# Patient Record
Sex: Female | Born: 1990 | Hispanic: No | Marital: Single | Smoking: Former smoker
Health system: Southern US, Community
[De-identification: ages and names within clinical notes are randomized; demographics above are authoritative.]

## PROBLEM LIST (undated history)

## (undated) DIAGNOSIS — Z9889 Other specified postprocedural states: Secondary | ICD-10-CM

## (undated) DIAGNOSIS — Z8759 Personal history of other complications of pregnancy, childbirth and the puerperium: Secondary | ICD-10-CM

## (undated) DIAGNOSIS — F419 Anxiety disorder, unspecified: Secondary | ICD-10-CM

## (undated) DIAGNOSIS — Q784 Enchondromatosis: Secondary | ICD-10-CM

## (undated) DIAGNOSIS — N941 Unspecified dyspareunia: Secondary | ICD-10-CM

## (undated) DIAGNOSIS — N912 Amenorrhea, unspecified: Secondary | ICD-10-CM

## (undated) DIAGNOSIS — R102 Pelvic and perineal pain unspecified side: Secondary | ICD-10-CM

## (undated) DIAGNOSIS — R112 Nausea with vomiting, unspecified: Secondary | ICD-10-CM

## (undated) HISTORY — PX: OTHER SURGICAL HISTORY: SHX169

---
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
## 2010-10-29 ENCOUNTER — Emergency Department: Payer: Self-pay | Admitting: Emergency Medicine

## 2014-03-10 ENCOUNTER — Emergency Department: Payer: Self-pay | Admitting: Emergency Medicine

## 2014-03-10 LAB — COMPREHENSIVE METABOLIC PANEL
ALBUMIN: 4.1 g/dL (ref 3.4–5.0)
ALT: 29 U/L
Alkaline Phosphatase: 39 U/L — ABNORMAL LOW
Anion Gap: 7 (ref 7–16)
BUN: 5 mg/dL — ABNORMAL LOW (ref 7–18)
Bilirubin,Total: 0.5 mg/dL (ref 0.2–1.0)
CALCIUM: 9 mg/dL (ref 8.5–10.1)
CHLORIDE: 106 mmol/L (ref 98–107)
CO2: 26 mmol/L (ref 21–32)
CREATININE: 0.71 mg/dL (ref 0.60–1.30)
EGFR (African American): >60
EGFR (Non-African Amer.): >60
Glucose: 85 mg/dL (ref 65–99)
OSMOLALITY: 274 (ref 275–301)
Potassium: 3.4 mmol/L — ABNORMAL LOW (ref 3.5–5.1)
SGOT(AST): 30 U/L (ref 15–37)
SODIUM: 139 mmol/L (ref 136–145)
Total Protein: 7.9 g/dL (ref 6.4–8.2)

## 2014-03-10 LAB — URINALYSIS, COMPLETE
BILIRUBIN, UR: NEGATIVE
Blood: NEGATIVE
GLUCOSE, UR: NEGATIVE mg/dL (ref 0–75)
Ketone: NEGATIVE
LEUKOCYTE ESTERASE: NEGATIVE
Nitrite: NEGATIVE
Ph: 8 (ref 4.5–8.0)
Protein: NEGATIVE
RBC,UR: NONE SEEN /HPF (ref 0–5)
SPECIFIC GRAVITY: 1.015 (ref 1.003–1.030)

## 2014-03-10 LAB — DRUG SCREEN, URINE
Amphetamines, Ur Screen: NEGATIVE (ref ?–1000)
BARBITURATES, UR SCREEN: NEGATIVE (ref ?–200)
Benzodiazepine, Ur Scrn: NEGATIVE (ref ?–200)
Cannabinoid 50 Ng, Ur ~~LOC~~: POSITIVE (ref ?–50)
Cocaine Metabolite,Ur ~~LOC~~: NEGATIVE (ref ?–300)
MDMA (Ecstasy)Ur Screen: NEGATIVE (ref ?–500)
Methadone, Ur Screen: NEGATIVE (ref ?–300)
Opiate, Ur Screen: NEGATIVE (ref ?–300)
Phencyclidine (PCP) Ur S: NEGATIVE (ref ?–25)
TRICYCLIC, UR SCREEN: NEGATIVE (ref ?–1000)

## 2014-03-10 LAB — CBC WITH DIFFERENTIAL/PLATELET
BASOS PCT: 0.8 %
Basophil #: 0 10*3/uL (ref 0.0–0.1)
EOS PCT: 3.1 %
Eosinophil #: 0.2 10*3/uL (ref 0.0–0.7)
HCT: 44.8 % (ref 35.0–47.0)
HGB: 15.1 g/dL (ref 12.0–16.0)
LYMPHS PCT: 33.8 %
Lymphocyte #: 1.8 10*3/uL (ref 1.0–3.6)
MCH: 31.4 pg (ref 26.0–34.0)
MCHC: 33.7 g/dL (ref 32.0–36.0)
MCV: 93 fL (ref 80–100)
Monocyte #: 0.5 x10 3/mm (ref 0.2–0.9)
Monocyte %: 8.7 %
Neutrophil #: 2.9 10*3/uL (ref 1.4–6.5)
Neutrophil %: 53.6 %
Platelet: 212 10*3/uL (ref 150–440)
RBC: 4.81 10*6/uL (ref 3.80–5.20)
RDW: 13.3 % (ref 11.5–14.5)
WBC: 5.4 10*3/uL (ref 3.6–11.0)

## 2014-03-15 ENCOUNTER — Emergency Department: Payer: Self-pay | Admitting: Student

## 2014-08-16 ENCOUNTER — Observation Stay: Payer: Self-pay | Admitting: Obstetrics & Gynecology

## 2014-10-02 ENCOUNTER — Observation Stay: Payer: Self-pay

## 2014-10-22 ENCOUNTER — Observation Stay: Payer: Self-pay | Admitting: Obstetrics and Gynecology

## 2014-11-04 ENCOUNTER — Inpatient Hospital Stay
Admit: 2014-11-04 | Disposition: A | Discharge status: Home / Self Care | Payer: Self-pay | Attending: Advanced Practice Midwife | Admitting: Advanced Practice Midwife

## 2014-11-04 LAB — CBC WITH DIFFERENTIAL/PLATELET
BASOS ABS: 0 10*3/uL (ref 0.0–0.1)
Basophil %: 0.3 %
EOS ABS: 0.1 10*3/uL (ref 0.0–0.7)
Eosinophil %: 0.9 %
HCT: 35.8 % (ref 35.0–47.0)
HGB: 11.6 g/dL — ABNORMAL LOW (ref 12.0–16.0)
LYMPHS ABS: 1 10*3/uL (ref 1.0–3.6)
Lymphocyte %: 11.7 %
MCH: 26.7 pg (ref 26.0–34.0)
MCHC: 32.5 g/dL (ref 32.0–36.0)
MCV: 82 fL (ref 80–100)
Monocyte #: 0.7 x10 3/mm (ref 0.2–0.9)
Monocyte %: 7.7 %
Neutrophil #: 6.8 10*3/uL — ABNORMAL HIGH (ref 1.4–6.5)
Neutrophil %: 79.4 %
Platelet: 210 10*3/uL (ref 150–440)
RBC: 4.36 10*6/uL (ref 3.80–5.20)
RDW: 15.1 % — AB (ref 11.5–14.5)
WBC: 8.6 10*3/uL (ref 3.6–11.0)

## 2014-11-04 LAB — DRUG SCREEN, URINE
Amphetamines, Ur Screen: NEGATIVE
BARBITURATES, UR SCREEN: NEGATIVE
Benzodiazepine, Ur Scrn: NEGATIVE
Cannabinoid 50 Ng, Ur ~~LOC~~: POSITIVE
Cocaine Metabolite,Ur ~~LOC~~: NEGATIVE
MDMA (Ecstasy)Ur Screen: NEGATIVE
METHADONE, UR SCREEN: NEGATIVE
OPIATE, UR SCREEN: NEGATIVE
Phencyclidine (PCP) Ur S: NEGATIVE
TRICYCLIC, UR SCREEN: NEGATIVE

## 2014-11-04 LAB — GC/CHLAMYDIA PROBE AMP

## 2014-11-06 LAB — HEMATOCRIT: HCT: 30.6 % — ABNORMAL LOW (ref 35.0–47.0)

## 2014-12-17 NOTE — H&P (Signed)
L&D Evaluation:  History Expanded:  HPI 24 yo G1 at 6228 weeks w vag bleeding episode this am. Bleeding was brief and noted at work.   No significant pain.  Sex night before last.  No compl this preg.  [facility] Care at [facility].   Gravida 1   Term 0   Patient's Medical History No Chronic Illness   Patient's Surgical History none   Medications Pre Natal Vitamins   Allergies NKDA   Social History none   Family History Non-Contributory   ROS:  ROS All systems were reviewed.  HEENT, CNS, GI, GU, Respiratory, CV, Renal and Musculoskeletal systems were found to be normal.   Exam:  Vital Signs stable   General no apparent distress   Mental Status clear   Abdomen gravid, non-tender   Estimated Fetal Weight Average for gestational age   Edema no edema   Pelvic no external lesions, cervix closed and thick, (cervix is soft, no dilation effacement or descent)   Mebranes Intact   FHT normal rate with no decels   Ucx absent   Impression:  Impression Vaginal bleeding, post coital.  No signs or symptoms of Preterm Labor.   Plan:  Plan EFM/NST   Comments Pelvic rest for a few days tp monitor for bleeding. Keep follow-up appointment Tuesday.   Follow Up Appointment already scheduled   Electronic Signatures: Letitia LibraHarris, Semaj Kham Paul (MD)  (Signed 08-Jan-16 10:13)  Authored: L&D Evaluation   Last Updated: 08-Jan-16 10:13 by Letitia LibraHarris, Latria Mccarron Paul (MD)

## 2014-12-17 NOTE — H&P (Signed)
L&D Evaluation:  History:  HPI 24 yo G1 at 35.[redacted] weeks GA with an EDC of 11/04/14 presents to L&D after reportedly calling the office and being told to come to the hospital with reports of mucus with some blood present in it. Her prenatal course is significant for anxiety and depression. She also has been using MJ this pregnancy with the last +UDS in December. She was seen in the office yesterday with reports of abdominal pain but was not able to pinpoint where she was hurting other than "on my bone." She had her cervix checked and was 2/80/-2 at that time. She was instructed that she may have some bleeding at that point. She is AB+, VI, RI, GBS unknown   Presents with mucus discharge   Patient's Medical History No Chronic Illness   Patient's Surgical History none   Medications Pre Natal Vitamins   Allergies other, bee stings, cinnamon   Social History drugs  MJ use   Family History Non-Contributory   ROS:  ROS All systems were reviewed.  HEENT, CNS, GI, GU, Respiratory, CV, Renal and Musculoskeletal systems were found to be normal.   Exam:  Vital Signs stable   General no apparent distress   Mental Status clear   Chest clear   Heart normal sinus rhythm   Abdomen gravid, non-tender   Pelvic 1.5/70-80/-2 unchanged. no blood seen on glove   Mebranes Intact   FHT normal rate with no decels, 130s, moderate variability, +accels, no decels   Ucx other, occassional   Skin dry, no lesions, no rashes   Lymph no lymphadenopathy   Impression:  Impression reactive NST, IUP at 35.2, vaginal discharge   Plan:  Plan EFM/NST, monitor contractions and for cervical change, discharge home with PTL precautions if cervix does not change.   Follow Up Appointment already scheduled. in 1 week   Electronic Signatures: Jannet MantisSubudhi, Bentley Fissel (CNM)  (Signed 24-Feb-16 16:01)  Authored: L&D Evaluation   Last Updated: 24-Feb-16 16:01 by Jannet MantisSubudhi, Rito Lecomte (CNM)

## 2014-12-17 NOTE — H&P (Signed)
L&D Evaluation:  History:  HPI 24 yo G1 at 38 weeks 1 day gestation with an EDC of 11/04/14 presents after clinic BP of 140/100 no repeat.  +1 glucose on that particular visit as well as on one prior visit with +4 glucose but negative protein (1-hr was 60).  NO HA, VISION CHANGES, RUQ/EPIGASTRIC PAIN.  Weight stable from last week.  Her prenatal course is significant for anxiety and depression. She also has been using MJ this pregnancy with the last +UDS in December.  PNL AB+, VI, RI, GBS unknown   Presents with mucus discharge   Patient's Medical History No Chronic Illness   Patient's Surgical History none   Medications Pre Natal Vitamins   Allergies other, bee stings, cinnamon   Social History drugs  MJ use   Family History Non-Contributory   ROS:  ROS All systems were reviewed.  HEENT, CNS, GI, GU, Respiratory, CV, Renal and Musculoskeletal systems were found to be normal.   Exam:  Vital Signs stable  No elevated BP's noted through her stay in L&D   General no apparent distress   Mental Status clear   Chest clear   Heart normal sinus rhythm   Abdomen gravid, non-tender   Pelvic 2cm per nursing stafff   Mebranes Intact   FHT normal rate with no decels, 130.  moderate variability, +accels, no decels   Ucx other, occassional   Skin dry, no lesions, no rashes   Lymph no lymphadenopathy   Impression:  Impression reactive NST, IUP at38 week 1 day with negative preeclampsia work up and normotensive herre on L&D   Plan:  Plan monitor contractions and for cervical change   Comments - no follow up appointment made in clinic today will arrange follow up later this week.  No evidence of hypertensive disorder complicating pregnancy here on L&D ith normal labs and category I fetal tracing   Follow Up Appointment need to schedule. in 1 week   Electronic Signatures: Lorrene ReidStaebler, Terrika Zuver M (MD)  (Signed 15-Mar-16 16:16)  Authored: L&D Evaluation   Last Updated:  15-Mar-16 16:16 by Lorrene ReidStaebler, Denai Caba M (MD)

## 2014-12-17 NOTE — H&P (Signed)
L&D Evaluation:  History Expanded:  HPI 24 yo G1 with EDD of 11/04/14 per LMP & 8 wk US, presents at 40 wks with c/o contractions since 0230 this am. Denies LOF, VB or decreased FM. [facility] notable for early entry to care, anxiety and depression and frequent marijuana use (admits to use early this am.)  PNL AB+, VI, RI, GBS positive. TDAP given 08/20/14   Presents with mucus discharge   Patient's Medical History No Chronic Illness   Patient's Surgical History arm/hand   Medications Pre [place]atal Vitamins   Allergies other, bee stings, cinnamon   Social History drugs  MJ use, d/c tobacco with + pregnancy   Family History Non-Contributory   ROS:  ROS All systems were reviewed.  HEENT, CNS, GI, GU, Respiratory, CV, Renal and Musculoskeletal systems were found to be normal.   Exam:  Vital Signs stable   General no apparent distress   Mental Status clear   Abdomen gravid, tender with contractions   Estimated Fetal Weight Average for gestational age   Pelvic 2 cm per RN on admission, now 5/100/0   Mebranes Intact   FHT normal rate with no decels, initially decreased variability. No category 1 tracing. US intermittantly picking up maternal HR d/t pt's position   Ucx regular, q 3-4 min   Skin dry, no lesions, no rashes   Impression:  Impression reactive NST, IUP at 40 wks in active labor   Plan:  Comments Admission for labor IV fluids, Ampicillin for GBS ppx, stadol, epidural when labs have resulted Anticipate vaginal delivery  Pt aware of breastfeeding policy with marijuana use. Will get UDS, expect positive d/t recent use.   Follow Up Appointment in 1 week   Electronic Signatures: Vella KohlerBrothers, Sandra Mckeon Buchanan (CNM)  (Signed 28-Mar-16 12:30)  Authored: L&D Evaluation   Last Updated: 28-Mar-16 12:30 by Vella KohlerBrothers, Sandra Buchanan (CNM)

## 2016-10-26 ENCOUNTER — Telehealth: Payer: Self-pay | Admitting: Obstetrics & Gynecology

## 2016-10-26 NOTE — Telephone Encounter (Signed)
Pt is calling due to producing milk. Pt report she stop breast feeding 7 months ago. Please Advise. [phone]

## 2016-10-26 NOTE — Telephone Encounter (Signed)
Tight bra and avoidance of stimulation to the breasts.  Sometimes it takes a while.

## 2016-10-26 NOTE — Telephone Encounter (Signed)
Pt aware. States she purchased 4 new bras 2 weeks ago and was fitted for them. She says that her breasts feel full and if she manually compresses them droplets of milk come out. Pt has recently taken negatve UPT and not on any birth control. She plans to schedule appt if concerns increase.

## 2016-10-26 NOTE — Telephone Encounter (Signed)
Please advise. Thank you

## 2019-08-10 NOTE — L&D Delivery Note (Signed)
Delivery Note At 7:25 AM a viable and healthy baby girl "Sandra Buchanan" was delivered via Vaginal, Spontaneous (Presentation:  Left Occiput Anterior).  APGAR: 8,9 ; weight 7 lb 4 oz (3289 g).   Placenta status: Spontaneous, Intact.  Cord: 3 vessels with the following complications: None.   SROM at 0720, clear fluid  Anesthesia:  None Episiotomy: None Lacerations: 1st degree Suture Repair: 2.0 vicryl Est. Blood Loss (mL):  200  Mom to postpartum.  Baby to Couplet care / Skin to Skin.  29yo G2p1001 at 39+2wks, presenting in active labor. Pt presented to triage at 4cm with frequent contractions. While we were awaiting her covid status, she precipitously delivered in triage, calling out as the baby's head emerged. The body was delivered by the nurse and I entered the room immediately after. Placenta delivered with me spontaneously with fundal massage, intact. A small first deg was repaired with a single stitch at the bedside, and her fundus was firm. Pt tolerated the procedure well.  Christeen Douglas 03/28/2020, 7:46 AM

## 2019-09-05 ENCOUNTER — Emergency Department: Payer: Medicaid Other

## 2019-09-05 ENCOUNTER — Emergency Department
Admission: EM | Admit: 2019-09-05 | Discharge: 2019-09-05 | Disposition: A | Discharge status: Home / Self Care | Payer: Medicaid Other | Attending: Student | Admitting: Student

## 2019-09-05 ENCOUNTER — Encounter: Payer: Self-pay | Admitting: Intensive Care

## 2019-09-05 ENCOUNTER — Other Ambulatory Visit: Payer: Self-pay

## 2019-09-05 DIAGNOSIS — Z3A10 10 weeks gestation of pregnancy: Secondary | ICD-10-CM | POA: Diagnosis not present

## 2019-09-05 DIAGNOSIS — O219 Vomiting of pregnancy, unspecified: Secondary | ICD-10-CM | POA: Insufficient documentation

## 2019-09-05 DIAGNOSIS — Z87891 Personal history of nicotine dependence: Secondary | ICD-10-CM | POA: Diagnosis not present

## 2019-09-05 LAB — URINALYSIS, COMPLETE (UACMP) WITH MICROSCOPIC
Bacteria, UA: NONE SEEN
Bilirubin Urine: NEGATIVE
Glucose, UA: NEGATIVE mg/dL
Hgb urine dipstick: NEGATIVE
Ketones, ur: 20 mg/dL — AB
Nitrite: NEGATIVE
Protein, ur: 30 mg/dL — AB
Specific Gravity, Urine: 1.027 (ref 1.005–1.030)
pH: 6 (ref 5.0–8.0)

## 2019-09-05 LAB — COMPREHENSIVE METABOLIC PANEL
ALT: 13 U/L (ref 0–44)
AST: 17 U/L (ref 15–41)
Albumin: 4.2 g/dL (ref 3.5–5.0)
Alkaline Phosphatase: 31 U/L — ABNORMAL LOW (ref 38–126)
Anion gap: 10 (ref 5–15)
BUN: 7 mg/dL (ref 6–20)
CO2: 22 mmol/L (ref 22–32)
Calcium: 9.6 mg/dL (ref 8.9–10.3)
Chloride: 102 mmol/L (ref 98–111)
Creatinine, Ser: 0.4 mg/dL — ABNORMAL LOW (ref 0.44–1.00)
GFR calc Af Amer: >60 mL/min (ref >60)
GFR calc non Af Amer: >60 mL/min (ref >60)
Glucose, Bld: 99 mg/dL (ref 70–99)
Potassium: 3.3 mmol/L — ABNORMAL LOW (ref 3.5–5.1)
Sodium: 134 mmol/L — ABNORMAL LOW (ref 135–145)
Total Bilirubin: 0.9 mg/dL (ref 0.3–1.2)
Total Protein: 7.8 g/dL (ref 6.5–8.1)

## 2019-09-05 LAB — CBC
HCT: 39.1 % (ref 36.0–46.0)
Hemoglobin: 14.2 g/dL (ref 12.0–15.0)
MCH: 31.8 pg (ref 26.0–34.0)
MCHC: 36.3 g/dL — ABNORMAL HIGH (ref 30.0–36.0)
MCV: 87.5 fL (ref 80.0–100.0)
Platelets: 260 10*3/uL (ref 150–400)
RBC: 4.47 MIL/uL (ref 3.87–5.11)
RDW: 12.2 % (ref 11.5–15.5)
WBC: 8.3 10*3/uL (ref 4.0–10.5)
nRBC: 0 % (ref 0.0–0.2)

## 2019-09-05 LAB — LIPASE, BLOOD: Lipase: 29 U/L (ref 11–51)

## 2019-09-05 LAB — HCG, QUANTITATIVE, PREGNANCY: hCG, Beta Chain, Quant, S: 83333 m[IU]/mL — ABNORMAL HIGH (ref <5)

## 2019-09-05 LAB — POCT PREGNANCY, URINE: Preg Test, Ur: POSITIVE — AB

## 2019-09-05 MED ORDER — DEXTROSE-NACL 5-0.45 % IV SOLN: Freq: Once | INTRAVENOUS | Status: AC

## 2019-09-05 MED ORDER — PROMETHAZINE HCL 12.5 MG PO TABS: 12.5000 mg | ORAL_TABLET | Freq: Four times a day (QID) | ORAL | 0 refills | Status: DC | PRN

## 2019-09-05 MED ORDER — SODIUM CHLORIDE 0.9 % IV BOLUS
1000.0000 mL | Freq: Once | INTRAVENOUS | Status: AC
  Administered 2019-09-05: 1000 mL via INTRAVENOUS

## 2019-09-05 MED ORDER — ONDANSETRON HCL 4 MG/2ML IJ SOLN
4.0000 mg | Freq: Once | INTRAMUSCULAR | Status: AC
Start: 2019-09-05 — End: 2019-09-05
  Administered 2019-09-05: 4 mg via INTRAVENOUS
  Filled 2019-09-05: qty 2

## 2019-09-05 NOTE — ED Triage Notes (Signed)
Patient reports being [redacted] weeks pregnant and here due to nausea. Last menstrual cycle start date November 18th. Appointment with [facility] February 8th

## 2019-09-05 NOTE — ED Notes (Signed)
Pt has tolerated ice chips, encouraged her to drink some of the water so we can ensure that she is able to keep po down.  Pt states that she is feeling better (fluids have infused)

## 2019-09-05 NOTE — ED Provider Notes (Signed)
[facility] Emergency Department Provider Note  ____________________________________________   First MD Initiated Contact with Patient 09/05/19 1315     (approximate)  I have reviewed the triage vital signs and the nursing notes.  History  Chief Complaint Emesis During Pregnancy    HPI Sandra Buchanan is a 29 y.o. female [redacted] weeks pregnant by LMP (06/27/2019) who presents to the emergency department for nausea and vomiting.  Patient says she has had persistent nausea and inability to tolerate PO for several weeks now.  She is having multiple episodes of nonbloody, nonbilious emesis daily.  Recently prescribed doxylamine and B6, took the first dose last night but was unable to keep it down, therefore recommended to seek care in the emergency department.  She reports some mild associated abdominal discomfort, primarily only with emesis episodes.  Pain is located to the upper abdomen, cramping/burning, no radiation, no alleviating or aggravating components.  She reports having nausea in pregnancy with her prior child, but not as severe as this pregnancy.  She has not yet had her first formal OB visit or ultrasound yet.  She denies any vaginal bleeding, leakage of fluid, vaginal discharge, or dysuria.   Past Medical Hx History reviewed. No pertinent past medical history.  Problem List There are no problems to display for this patient.   Past Surgical Hx History reviewed. No pertinent surgical history.  Medications Prior to Admission medications   Not on File    Allergies Patient has no known allergies.  Family Hx History reviewed. No pertinent family history.  Social Hx Social History   Tobacco Use  . Smoking status: Former Research scientist (life sciences)  . Smokeless tobacco: Never Used  Substance Use Topics  . Alcohol use: Never  . Drug use: Yes    Types: Marijuana     Review of Systems  Constitutional: Negative for fever, chills. Eyes: Negative for visual  changes. ENT: Negative for sore throat. Cardiovascular: Negative for chest pain. Respiratory: Negative for shortness of breath. Gastrointestinal: + for nausea, vomiting.  Genitourinary: Negative for dysuria. Musculoskeletal: Negative for leg swelling. Skin: Negative for rash. Neurological: Negative for for headaches.   Physical Exam  Vital Signs: ED Triage Vitals [09/05/19 1259]  Enc Vitals Group     BP 134/83     Pulse Rate 83     Resp 14     Temp 98.2 F (36.8 C)     Temp Source Oral     SpO2 100 %     Weight 178 lb (80.7 kg)     Height 5\' 7"  (1.702 m)     Head Circumference      Peak Flow      Pain Score 7     Pain Loc      Pain Edu?      Excl. in [place]?     Constitutional: Alert and oriented.  Head: Normocephalic. Atraumatic. Eyes: Conjunctivae clear. Sclera anicteric. Nose: No congestion. No rhinorrhea. Mouth/Throat: Wearing mask.  Neck: No stridor.   Cardiovascular: Normal rate, regular rhythm. Extremities well perfused. Respiratory: Normal respiratory effort.  Lungs CTAB. Gastrointestinal: Soft. Non-tender. Non-distended.  Musculoskeletal: No lower extremity edema. No deformities. Neurologic:  Normal speech and language. No gross focal neurologic deficits are appreciated.  Skin: Skin is warm, dry and intact. No rash noted. Psychiatric: Mood and affect are appropriate for situation.   Radiology  [place]:  IMPRESSION:  Single live intrauterine gestation at 10 weeks 4 days EGA by crown-rump length.  No acute abnormalities.  Procedures  Procedure(s) performed (including critical care):  Procedures   Initial Impression / Assessment and Plan / ED Course  28 y.o. female who presents to the ED for nausea and vomiting in pregnancy.  Ddx: nausea and vomiting of pregnancy, hyperemesis gravidarum, pancreatitis, UTI  Will obtain labs.  Also obtain formal ultrasound to confirm IUP as she has not had one yet this pregnancy.  Fluids, symptom control and  reassess.  UA without evidence of infection, small ketones consistent with dehydration. Receiving NS bolus x 1 and D5 1/2NS x 1 as well.  Remainder of labs without actual derangements.  Ultrasound confirmed single live IUP without acute abnormalities.  Patient reports improvement in symptoms after fluids and Zofran.  Tolerated PO without difficulty, reports improvement in her appetite and requesting discharge.  Will discharge with Rx for Phenergan as needed, and advised continuation of her doxylamine and B6 to help control symptoms.  Follow-up with OB.  Given return precautions.   Final Clinical Impression(s) / ED Diagnosis  Final diagnoses:  Nausea/vomiting in pregnancy       Note:  This document was prepared using Dragon voice recognition software and may include unintentional dictation errors.   Miguel Aschoff., MD 09/05/19 1700

## 2019-09-05 NOTE — ED Notes (Signed)
Pt has been give another ice water and gingerale with ice chips to work on taking sips to see how she will tolerate, emesis bag at the bedside.  Stretcher and lighting adjusted for pt comfort.

## 2019-09-05 NOTE — Discharge Instructions (Addendum)
Thank you for letting us take care of you in the emergency department today.   Please continue to take any regular, prescribed medications, including your prenatal vitamins and the B6 and doxylamine prescribed by your OB doctor  New medications we have prescribed:  - Phenergan - take as needed for nausea/vomiting that is not controlled with your B6 and doxylamine first  Please follow up with: - Your OBGYN doctor to review your ER visit and follow up on your symptoms.   Please return to the ER for any new or worsening symptoms.

## 2019-09-17 ENCOUNTER — Other Ambulatory Visit: Payer: Self-pay

## 2019-09-17 DIAGNOSIS — Z3482 Encounter for supervision of other normal pregnancy, second trimester: Secondary | ICD-10-CM | POA: Insufficient documentation

## 2019-09-17 DIAGNOSIS — R87612 Low grade squamous intraepithelial lesion on cytologic smear of cervix (LGSIL): Secondary | ICD-10-CM | POA: Diagnosis not present

## 2019-09-17 DIAGNOSIS — Z369 Encounter for antenatal screening, unspecified: Secondary | ICD-10-CM

## 2019-09-17 DIAGNOSIS — Z8759 Personal history of other complications of pregnancy, childbirth and the puerperium: Secondary | ICD-10-CM | POA: Diagnosis not present

## 2019-09-17 DIAGNOSIS — O3680X1 Pregnancy with inconclusive fetal viability, fetus 1: Secondary | ICD-10-CM | POA: Diagnosis not present

## 2019-09-17 DIAGNOSIS — Z3481 Encounter for supervision of other normal pregnancy, first trimester: Secondary | ICD-10-CM | POA: Diagnosis not present

## 2019-09-17 DIAGNOSIS — Z113 Encounter for screening for infections with a predominantly sexual mode of transmission: Secondary | ICD-10-CM | POA: Diagnosis not present

## 2019-09-20 ENCOUNTER — Telehealth: Payer: Self-pay | Admitting: Obstetrics and Gynecology

## 2019-09-20 NOTE — Telephone Encounter (Signed)
I spoke with [name] regarding her appointment scheduled for 09/24/19 at [facility] for first trimester screening.  Due to recommendations during COVID, and the fact that she had MaterniT21 PLUS with SCA drawn at [facility] on 09/17/19 as well as a prior dating ultrasound, we will cancel the genetic counseling and first trimester ultrasound at [facility].  The patient reported no concerns in her pregnancy history or family history that would warrant genetic counseling and based upon her age is at low risk for aneuploidy. Should her Maternit21 results return abnormal, we are happy to reschedule this visit.  We may be reached at [phone].  Sandra Anderson, MS, CGC

## 2019-09-24 ENCOUNTER — Ambulatory Visit: Payer: Medicaid Other

## 2019-09-24 ENCOUNTER — Ambulatory Visit: Admission: RE | Admit: 2019-09-24 | Payer: Medicaid Other | Source: Ambulatory Visit

## 2019-10-16 DIAGNOSIS — R102 Pelvic and perineal pain: Secondary | ICD-10-CM | POA: Diagnosis not present

## 2019-10-16 DIAGNOSIS — O26899 Other specified pregnancy related conditions, unspecified trimester: Secondary | ICD-10-CM | POA: Diagnosis not present

## 2019-10-16 DIAGNOSIS — N942 Vaginismus: Secondary | ICD-10-CM | POA: Diagnosis not present

## 2019-10-16 DIAGNOSIS — Z3689 Encounter for other specified antenatal screening: Secondary | ICD-10-CM | POA: Diagnosis not present

## 2019-10-17 DIAGNOSIS — R102 Pelvic and perineal pain: Secondary | ICD-10-CM | POA: Diagnosis not present

## 2019-10-17 DIAGNOSIS — O26899 Other specified pregnancy related conditions, unspecified trimester: Secondary | ICD-10-CM | POA: Diagnosis not present

## 2020-01-09 DIAGNOSIS — Z3483 Encounter for supervision of other normal pregnancy, third trimester: Secondary | ICD-10-CM | POA: Diagnosis not present

## 2020-01-09 DIAGNOSIS — N898 Other specified noninflammatory disorders of vagina: Secondary | ICD-10-CM | POA: Diagnosis not present

## 2020-01-10 ENCOUNTER — Other Ambulatory Visit: Payer: Self-pay

## 2020-01-10 ENCOUNTER — Observation Stay
Admission: EM | Admit: 2020-01-10 | Discharge: 2020-01-10 | Disposition: A | Discharge status: Home / Self Care | Payer: Medicaid Other

## 2020-01-10 DIAGNOSIS — O219 Vomiting of pregnancy, unspecified: Secondary | ICD-10-CM | POA: Diagnosis present

## 2020-01-10 DIAGNOSIS — Q784 Enchondromatosis: Secondary | ICD-10-CM | POA: Insufficient documentation

## 2020-01-10 DIAGNOSIS — Z79899 Other long term (current) drug therapy: Secondary | ICD-10-CM | POA: Diagnosis not present

## 2020-01-10 DIAGNOSIS — Z3A28 28 weeks gestation of pregnancy: Secondary | ICD-10-CM | POA: Insufficient documentation

## 2020-01-10 DIAGNOSIS — O26893 Other specified pregnancy related conditions, third trimester: Principal | ICD-10-CM | POA: Insufficient documentation

## 2020-01-10 DIAGNOSIS — Z87891 Personal history of nicotine dependence: Secondary | ICD-10-CM | POA: Insufficient documentation

## 2020-01-10 DIAGNOSIS — O26899 Other specified pregnancy related conditions, unspecified trimester: Secondary | ICD-10-CM | POA: Insufficient documentation

## 2020-01-10 DIAGNOSIS — Z8759 Personal history of other complications of pregnancy, childbirth and the puerperium: Secondary | ICD-10-CM | POA: Insufficient documentation

## 2020-01-10 DIAGNOSIS — O212 Late vomiting of pregnancy: Secondary | ICD-10-CM | POA: Diagnosis not present

## 2020-01-10 HISTORY — DX: Personal history of other complications of pregnancy, childbirth and the puerperium: Z87.59

## 2020-01-10 LAB — URINALYSIS, COMPLETE (UACMP) WITH MICROSCOPIC
Bilirubin Urine: NEGATIVE
Glucose, UA: NEGATIVE mg/dL
Hgb urine dipstick: NEGATIVE
Ketones, ur: NEGATIVE mg/dL
Leukocytes,Ua: NEGATIVE
Nitrite: NEGATIVE
Protein, ur: NEGATIVE mg/dL
Specific Gravity, Urine: 1.02 (ref 1.005–1.030)
pH: 7 (ref 5.0–8.0)

## 2020-01-10 MED ORDER — ONDANSETRON 4 MG PO TBDP
ORAL_TABLET | ORAL | Status: AC
  Administered 2020-01-10: 8 mg via ORAL
  Filled 2020-01-10: qty 2

## 2020-01-10 MED ORDER — ONDANSETRON 4 MG PO TBDP: 8.0000 mg | ORAL_TABLET | Freq: Three times a day (TID) | ORAL | Status: DC | PRN

## 2020-01-10 MED ORDER — PRENATAL MULTIVITAMIN CH: 1.0000 | ORAL_TABLET | Freq: Every day | ORAL | Status: DC

## 2020-01-10 MED ORDER — ONDANSETRON 8 MG PO TBDP: 8.0000 mg | ORAL_TABLET | Freq: Three times a day (TID) | ORAL | 3 refills | Status: DC | PRN

## 2020-01-10 MED ORDER — CALCIUM CARBONATE ANTACID 500 MG PO CHEW: 2.0000 | CHEWABLE_TABLET | ORAL | Status: DC | PRN

## 2020-01-10 NOTE — OB Triage Note (Signed)
Pt presents c/o vomiting since 6am (4-5 times). Pt states she is usually able to control it but has not been able to and her manager will not allow her to come into work like that. Pt states "everything the doctor has prescribed just makes it worse". Pt denies ctx, LOF, or bleeding. Reports positive fetal movement. Vitals WNL. Will continue to monitor.

## 2020-01-10 NOTE — Discharge Summary (Signed)
Sandra Buchanan is a 29 y.o. female. She is at [redacted]w[redacted]d gestation. Patient's last menstrual period was 06/27/2019 (exact date). Estimated Date of Delivery: 04/02/20  Prenatal care site: [facility]    Chief complaint: vomiting 4-5 times since 0600.    [name] presents to L&D with complaints of vomiting 4-5 times since 0600.  Reports that she has had continued n/v in pregnancy and "everything the doctor has prescribed has made it worse."  S: Resting comfortably. no CTX, no VB.no LOF,  Active fetal movement.   Maternal Medical History:  Past Medical Hx:  has a past medical history of History of gestational hypertension.    Past Surgical Hx:  has a past surgical history that includes Hand surgery (Right, 2008).   No Known Allergies   Prior to Admission medications   Medication Sig Start Date End Date Taking? Authorizing Provider  ondansetron (ZOFRAN-ODT) 8 MG disintegrating tablet Take 1 tablet (8 mg total) by mouth every 8 (eight) hours as needed for nausea or vomiting. 01/10/20   Gustavo Lah, CNM  Prenatal Vit-Fe Fumarate-FA (PRENATAL MULTIVITAMIN) TABS tablet Take 1 tablet by mouth daily at 12 noon. 01/10/20   Gustavo Lah, CNM  promethazine (PHENERGAN) 12.5 MG tablet Take 1 tablet (12.5 mg total) by mouth every 6 (six) hours as needed for nausea or vomiting. Patient not taking: Reported on 01/10/2020 09/05/19   Miguel Aschoff., MD    Social History: She  reports that she has quit smoking. She has never used smokeless tobacco. She reports current drug use. Drug: Marijuana. She reports that she does not drink alcohol.  Family History: Family history reviewed, no pertinent history, no history of gyn cancers  Review of Systems: A full review of systems was performed and negative except as noted in the HPI.    O:  BP 117/64 (BP Location: Right Arm)   Pulse 89   Temp 98.1 F (36.7 C) (Oral)   Resp 16   Ht 5\' 7"  (1.702 m)   Wt 85.3 kg   LMP 06/27/2019 (Exact Date)   SpO2 99%    BMI 29.44 kg/m  Results for orders placed or performed during the hospital encounter of 01/10/20 (from the past 48 hour(s))  Urinalysis, Complete w Microscopic   Collection Time: 01/10/20 10:31 AM  Result Value Ref Range   Color, Urine YELLOW (A) YELLOW   APPearance HAZY (A) CLEAR   Specific Gravity, Urine 1.020 1.005 - 1.030   pH 7.0 5.0 - 8.0   Glucose, UA NEGATIVE NEGATIVE mg/dL   Hgb urine dipstick NEGATIVE NEGATIVE   Bilirubin Urine NEGATIVE NEGATIVE   Ketones, ur NEGATIVE NEGATIVE mg/dL   Protein, ur NEGATIVE NEGATIVE mg/dL   Nitrite NEGATIVE NEGATIVE   Leukocytes,Ua NEGATIVE NEGATIVE   RBC / HPF 0-5 0 - 5 RBC/hpf   WBC, UA 0-5 0 - 5 WBC/hpf   Bacteria, UA RARE (A) NONE SEEN   Squamous Epithelial / LPF 0-5 0 - 5   Mucus PRESENT      Constitutional: NAD, AAOx3  HE/ENT: extraocular movements grossly intact, moist mucous membranes CV: RRR PULM: nl respiratory effort, CTABL     Abd: gravid, non-tender, non-distended, soft      Ext: Non-tender, Nonedmeatous   Psych: mood appropriate, speech normal Pelvic deferred  NST:  Baseline: 155 bpm Variability: moderate Accelerations present x >2 Decelerations absent Toco: quiet  Time 03/11/20   Assessment: 29 y.o. [redacted]w[redacted]d here for antenatal surveillance during pregnancy.  Principle diagnosis: Nausea  and vomiting in pregnancy   Plan:  Labor: not present.   Fetal Wellbeing: Reassuring Cat 1 tracing.  Reactive NST   Feeling better after zofran, able to tolerate PO fluids  Rx for Zofran sent to pharmacy   D/c home stable, precautions reviewed, follow-up as scheduled.   ----- Drinda Butts, CNM Certified Nurse Midwife [facility]

## 2020-02-07 DIAGNOSIS — Z419 Encounter for procedure for purposes other than remedying health state, unspecified: Secondary | ICD-10-CM | POA: Diagnosis not present

## 2020-03-05 DIAGNOSIS — Z3483 Encounter for supervision of other normal pregnancy, third trimester: Secondary | ICD-10-CM | POA: Diagnosis not present

## 2020-03-05 DIAGNOSIS — Z23 Encounter for immunization: Secondary | ICD-10-CM | POA: Diagnosis not present

## 2020-03-05 DIAGNOSIS — Z113 Encounter for screening for infections with a predominantly sexual mode of transmission: Secondary | ICD-10-CM | POA: Diagnosis not present

## 2020-03-09 DIAGNOSIS — Z419 Encounter for procedure for purposes other than remedying health state, unspecified: Secondary | ICD-10-CM | POA: Diagnosis not present

## 2020-03-17 ENCOUNTER — Observation Stay
Admission: EM | Admit: 2020-03-17 | Discharge: 2020-03-17 | Disposition: A | Discharge status: Home / Self Care | Payer: Medicaid Other

## 2020-03-17 ENCOUNTER — Encounter: Payer: Self-pay | Admitting: Obstetrics and Gynecology

## 2020-03-17 ENCOUNTER — Other Ambulatory Visit: Payer: Self-pay

## 2020-03-17 DIAGNOSIS — Z3A37 37 weeks gestation of pregnancy: Secondary | ICD-10-CM | POA: Insufficient documentation

## 2020-03-17 DIAGNOSIS — Z87891 Personal history of nicotine dependence: Secondary | ICD-10-CM | POA: Insufficient documentation

## 2020-03-17 DIAGNOSIS — R102 Pelvic and perineal pain: Secondary | ICD-10-CM | POA: Diagnosis not present

## 2020-03-17 DIAGNOSIS — O99891 Other specified diseases and conditions complicating pregnancy: Secondary | ICD-10-CM | POA: Diagnosis not present

## 2020-03-17 DIAGNOSIS — O26899 Other specified pregnancy related conditions, unspecified trimester: Secondary | ICD-10-CM | POA: Diagnosis present

## 2020-03-17 DIAGNOSIS — M545 Low back pain: Secondary | ICD-10-CM | POA: Diagnosis not present

## 2020-03-17 DIAGNOSIS — N898 Other specified noninflammatory disorders of vagina: Secondary | ICD-10-CM | POA: Diagnosis not present

## 2020-03-17 LAB — URINALYSIS, COMPLETE (UACMP) WITH MICROSCOPIC
Bacteria, UA: NONE SEEN
Bilirubin Urine: NEGATIVE
Glucose, UA: >=500 mg/dL — AB
Hgb urine dipstick: NEGATIVE
Ketones, ur: NEGATIVE mg/dL
Leukocytes,Ua: NEGATIVE
Nitrite: NEGATIVE
Protein, ur: NEGATIVE mg/dL
Specific Gravity, Urine: 1.028 (ref 1.005–1.030)
pH: 5 (ref 5.0–8.0)

## 2020-03-17 MED ORDER — CALCIUM CARBONATE ANTACID 500 MG PO CHEW: 2.0000 | CHEWABLE_TABLET | ORAL | Status: DC | PRN

## 2020-03-17 MED ORDER — ACETAMINOPHEN 325 MG PO TABS: 650.0000 mg | ORAL_TABLET | ORAL | Status: DC | PRN

## 2020-03-17 NOTE — Progress Notes (Signed)
Discharge instructions given from CNM and RN. Patient verbalized understanding and has no other questions.

## 2020-03-17 NOTE — Discharge Instructions (Signed)

## 2020-03-17 NOTE — OB Triage Note (Signed)
Patient G2P1 [redacted]w[redacted]d complains of back pain and pelvic pressure since last night. She states she has had this discomfort for a while but it got worse last night. Patient denies any other complaints. Monitors applied and assessing.

## 2020-03-17 NOTE — Discharge Summary (Signed)
Sandra Buchanan is a 29 y.o. female. She is at [redacted]w[redacted]d gestation. Patient's last menstrual period was 06/27/2019 (exact date). Estimated Date of Delivery: 04/02/20  Prenatal care site: [facility]  Chief complaint: lower back pain and pelvic pressure   Sandra Buchanan presents to L&D triage with complaints of lower back pain and pelvic pressure that started last night.  Warm bath has been helpful.  Has not tried Tylenol - does not like to take medication.  Reports continued increase vaginal discharge and unsure if she is leaking or not.  S: Resting comfortably. no CTX, no VB,  Active fetal movement.   Maternal Medical History:  Past Medical Hx:  has a past medical history of History of gestational hypertension.    Past Surgical Hx:  has a past surgical history that includes Hand surgery (Right, 2008).   No Known Allergies   Prior to Admission medications   Medication Sig Start Date End Date Taking? Authorizing Provider  Prenatal Vit-Fe Fumarate-FA (PRENATAL MULTIVITAMIN) TABS tablet Take 1 tablet by mouth daily at 12 noon. 01/10/20  Yes Gustavo Lah, CNM  ondansetron (ZOFRAN-ODT) 8 MG disintegrating tablet Take 1 tablet (8 mg total) by mouth every 8 (eight) hours as needed for nausea or vomiting. Patient not taking: Reported on 03/17/2020 01/10/20   Gustavo Lah, CNM    Social History: She  reports that she has quit smoking. She has never used smokeless tobacco. She reports current drug use. Drug: Marijuana. She reports that she does not drink alcohol.  Family History: Family history reviewed, no pertinent history, no history of gyn cancers  Review of Systems: A full review of systems was performed and negative except as noted in the HPI.    O:  BP 116/64 (BP Location: Right Arm)   Pulse (!) 104   Temp 98.1 F (36.7 C) (Oral)   Resp 16   Ht 5\' 7"  (1.702 m)   Wt 90.7 kg   LMP 06/27/2019 (Exact Date)   BMI 31.32 kg/m  Results for orders placed or performed during the hospital  encounter of 03/17/20 (from the past 48 hour(s))  Urinalysis, Complete w Microscopic   Collection Time: 03/17/20  9:52 AM  Result Value Ref Range   Color, Urine YELLOW (A) YELLOW   APPearance HAZY (A) CLEAR   Specific Gravity, Urine 1.028 1.005 - 1.030   pH 5.0 5.0 - 8.0   Glucose, UA >=500 (A) NEGATIVE mg/dL   Hgb urine dipstick NEGATIVE NEGATIVE   Bilirubin Urine NEGATIVE NEGATIVE   Ketones, ur NEGATIVE NEGATIVE mg/dL   Protein, ur NEGATIVE NEGATIVE mg/dL   Nitrite NEGATIVE NEGATIVE   Leukocytes,Ua NEGATIVE NEGATIVE   RBC / HPF 0-5 0 - 5 RBC/hpf   WBC, UA 0-5 0 - 5 WBC/hpf   Bacteria, UA NONE SEEN NONE SEEN   Squamous Epithelial / LPF 0-5 0 - 5   Mucus PRESENT      Constitutional: NAD, AAOx3  HE/ENT: extraocular movements grossly intact, moist mucous membranes CV: RRR PULM: nl respiratory effort, CTABL     Abd: gravid, non-tender, non-distended, soft      Ext: Non-tender, Nonedmeatous   Psych: mood appropriate, speech normal Pelvic: 3/70/+1 -> 3cm in office yesterday, no pooling  Fern: absent   NST:  Baseline: 145bpm Variability: moderate Accelerations present x >2 Decelerations absent Toco: occasional mild contractions  Time 06-04-1976   Assessment: 29 y.o. [redacted]w[redacted]d here for antenatal surveillance during pregnancy.  Principle diagnosis: Low back pain in pregnancy  Plan:  Labor: not present.   Fetal Wellbeing: Reassuring Cat 1 tracing.  Reactive NST   Normal vaginal discharge present - no signs of leakage of amniotic fluid   Reviewed comfort measures for back pain in pregnancy   D/c home stable, precautions reviewed, follow-up as scheduled.   ----- Sandra Buchanan, CNM Certified Nurse Midwife [place]  [facility]

## 2020-03-18 LAB — URINE CULTURE: Culture: NO GROWTH

## 2020-03-28 ENCOUNTER — Other Ambulatory Visit: Payer: Self-pay

## 2020-03-28 ENCOUNTER — Encounter: Payer: Self-pay | Admitting: Obstetrics and Gynecology

## 2020-03-28 ENCOUNTER — Inpatient Hospital Stay
Admission: EM | Admit: 2020-03-28 | Discharge: 2020-03-29 | DRG: 805 | Disposition: A | Discharge status: Home / Self Care | Payer: Medicaid Other | Attending: Obstetrics and Gynecology | Admitting: Obstetrics and Gynecology

## 2020-03-28 DIAGNOSIS — O9852 Other viral diseases complicating childbirth: Secondary | ICD-10-CM | POA: Diagnosis not present

## 2020-03-28 DIAGNOSIS — F1290 Cannabis use, unspecified, uncomplicated: Secondary | ICD-10-CM | POA: Diagnosis not present

## 2020-03-28 DIAGNOSIS — Z87891 Personal history of nicotine dependence: Secondary | ICD-10-CM

## 2020-03-28 DIAGNOSIS — O99324 Drug use complicating childbirth: Secondary | ICD-10-CM | POA: Diagnosis not present

## 2020-03-28 DIAGNOSIS — Z3A39 39 weeks gestation of pregnancy: Secondary | ICD-10-CM | POA: Diagnosis not present

## 2020-03-28 DIAGNOSIS — O98513 Other viral diseases complicating pregnancy, third trimester: Secondary | ICD-10-CM | POA: Diagnosis not present

## 2020-03-28 DIAGNOSIS — U071 COVID-19: Secondary | ICD-10-CM | POA: Diagnosis present

## 2020-03-28 DIAGNOSIS — O26893 Other specified pregnancy related conditions, third trimester: Secondary | ICD-10-CM | POA: Diagnosis not present

## 2020-03-28 DIAGNOSIS — Z3490 Encounter for supervision of normal pregnancy, unspecified, unspecified trimester: Secondary | ICD-10-CM

## 2020-03-28 LAB — CBC
HCT: 33.6 % — ABNORMAL LOW (ref 36.0–46.0)
Hemoglobin: 11.7 g/dL — ABNORMAL LOW (ref 12.0–15.0)
MCH: 26.5 pg (ref 26.0–34.0)
MCHC: 34.8 g/dL (ref 30.0–36.0)
MCV: 76.2 fL — ABNORMAL LOW (ref 80.0–100.0)
Platelets: 243 10*3/uL (ref 150–400)
RBC: 4.41 MIL/uL (ref 3.87–5.11)
RDW: 13.6 % (ref 11.5–15.5)
WBC: 6.7 10*3/uL (ref 4.0–10.5)
nRBC: 0 % (ref 0.0–0.2)

## 2020-03-28 LAB — SARS CORONAVIRUS 2 BY RT PCR (HOSPITAL ORDER, PERFORMED IN ~~LOC~~ HOSPITAL LAB): SARS Coronavirus 2: POSITIVE — AB

## 2020-03-28 LAB — TYPE AND SCREEN
ABO/RH(D): AB POS
Antibody Screen: NEGATIVE

## 2020-03-28 LAB — ABO/RH: ABO/RH(D): AB POS

## 2020-03-28 MED ORDER — ACETAMINOPHEN 325 MG PO TABS: 650.0000 mg | ORAL_TABLET | ORAL | Status: DC | PRN

## 2020-03-28 MED ORDER — FLEET ENEMA 7-19 GM/118ML RE ENEM: 1.0000 | ENEMA | RECTAL | Status: DC | PRN

## 2020-03-28 MED ORDER — LIDOCAINE HCL (PF) 1 % IJ SOLN: 30.0000 mL | INTRAMUSCULAR | Status: DC | PRN

## 2020-03-28 MED ORDER — SIMETHICONE 80 MG PO CHEW: 80.0000 mg | CHEWABLE_TABLET | ORAL | Status: DC | PRN

## 2020-03-28 MED ORDER — SODIUM CHLORIDE 0.9% FLUSH: 3.0000 mL | INTRAVENOUS | Status: DC | PRN

## 2020-03-28 MED ORDER — TETANUS-DIPHTH-ACELL PERTUSSIS 5-2.5-18.5 LF-MCG/0.5 IM SUSP
0.5000 mL | Freq: Once | INTRAMUSCULAR | Status: DC
  Filled 2020-03-28: qty 0.5

## 2020-03-28 MED ORDER — LACTATED RINGERS IV SOLN: INTRAVENOUS | Status: DC

## 2020-03-28 MED ORDER — OXYTOCIN-SODIUM CHLORIDE 30-0.9 UT/500ML-% IV SOLN
2.5000 [IU]/h | INTRAVENOUS | Status: DC
  Administered 2020-03-28: 2.5 [IU]/h via INTRAVENOUS
  Filled 2020-03-28 (×2): qty 500

## 2020-03-28 MED ORDER — DIBUCAINE (PERIANAL) 1 % EX OINT: 1.0000 "application " | TOPICAL_OINTMENT | CUTANEOUS | Status: DC | PRN

## 2020-03-28 MED ORDER — COCONUT OIL OIL
1.0000 "application " | TOPICAL_OIL | Status: DC | PRN
  Administered 2020-03-28: 1 via TOPICAL
  Filled 2020-03-28: qty 120

## 2020-03-28 MED ORDER — BENZOCAINE-MENTHOL 20-0.5 % EX AERO
1.0000 "application " | INHALATION_SPRAY | CUTANEOUS | Status: DC | PRN
  Administered 2020-03-28: 1 via TOPICAL
  Filled 2020-03-28: qty 56

## 2020-03-28 MED ORDER — OXYCODONE-ACETAMINOPHEN 5-325 MG PO TABS: 2.0000 | ORAL_TABLET | ORAL | Status: DC | PRN

## 2020-03-28 MED ORDER — PRENATAL MULTIVITAMIN CH
1.0000 | ORAL_TABLET | Freq: Every day | ORAL | Status: DC
  Administered 2020-03-28 – 2020-03-29 (×2): 1 via ORAL
  Filled 2020-03-28 (×2): qty 1

## 2020-03-28 MED ORDER — ONDANSETRON HCL 4 MG PO TABS
4.0000 mg | ORAL_TABLET | ORAL | Status: DC | PRN
  Filled 2020-03-28: qty 1

## 2020-03-28 MED ORDER — DIPHENHYDRAMINE HCL 25 MG PO CAPS: 25.0000 mg | ORAL_CAPSULE | Freq: Four times a day (QID) | ORAL | Status: DC | PRN

## 2020-03-28 MED ORDER — WITCH HAZEL-GLYCERIN EX PADS: 1.0000 "application " | MEDICATED_PAD | CUTANEOUS | Status: DC | PRN

## 2020-03-28 MED ORDER — BISACODYL 10 MG RE SUPP
10.0000 mg | Freq: Every day | RECTAL | Status: DC | PRN
  Filled 2020-03-28: qty 1

## 2020-03-28 MED ORDER — ONDANSETRON HCL 4 MG/2ML IJ SOLN: 4.0000 mg | INTRAMUSCULAR | Status: DC | PRN

## 2020-03-28 MED ORDER — SODIUM CHLORIDE 0.9 % IV SOLN: 250.0000 mL | INTRAVENOUS | Status: DC | PRN

## 2020-03-28 MED ORDER — SOD CITRATE-CITRIC ACID 500-334 MG/5ML PO SOLN: 30.0000 mL | ORAL | Status: DC | PRN

## 2020-03-28 MED ORDER — MEASLES, MUMPS & RUBELLA VAC IJ SOLR
0.5000 mL | Freq: Once | INTRAMUSCULAR | Status: DC
  Filled 2020-03-28: qty 0.5

## 2020-03-28 MED ORDER — LACTATED RINGERS IV SOLN: 500.0000 mL | INTRAVENOUS | Status: DC | PRN

## 2020-03-28 MED ORDER — SODIUM CHLORIDE 0.9% FLUSH: 3.0000 mL | Freq: Two times a day (BID) | INTRAVENOUS | Status: DC

## 2020-03-28 MED ORDER — IBUPROFEN 600 MG PO TABS
600.0000 mg | ORAL_TABLET | Freq: Four times a day (QID) | ORAL | Status: DC
  Administered 2020-03-28 – 2020-03-29 (×3): 600 mg via ORAL
  Filled 2020-03-28 (×4): qty 1

## 2020-03-28 MED ORDER — ZOLPIDEM TARTRATE 5 MG PO TABS: 5.0000 mg | ORAL_TABLET | Freq: Every evening | ORAL | Status: DC | PRN

## 2020-03-28 MED ORDER — OXYCODONE HCL 5 MG PO TABS: 5.0000 mg | ORAL_TABLET | ORAL | Status: DC | PRN

## 2020-03-28 MED ORDER — ONDANSETRON HCL 4 MG/2ML IJ SOLN: 4.0000 mg | Freq: Four times a day (QID) | INTRAMUSCULAR | Status: DC | PRN

## 2020-03-28 MED ORDER — OXYCODONE-ACETAMINOPHEN 5-325 MG PO TABS: 1.0000 | ORAL_TABLET | ORAL | Status: DC | PRN

## 2020-03-28 MED ORDER — IBUPROFEN 600 MG PO TABS
ORAL_TABLET | ORAL | Status: AC
  Administered 2020-03-28: 600 mg via ORAL
  Filled 2020-03-28: qty 1

## 2020-03-28 MED ORDER — SENNOSIDES-DOCUSATE SODIUM 8.6-50 MG PO TABS
2.0000 | ORAL_TABLET | ORAL | Status: DC
  Administered 2020-03-29: 2 via ORAL
  Filled 2020-03-28: qty 2

## 2020-03-28 MED ORDER — OXYTOCIN BOLUS FROM INFUSION
333.0000 mL | Freq: Once | INTRAVENOUS | Status: AC
  Administered 2020-03-28: 333 mL via INTRAVENOUS

## 2020-03-28 MED ORDER — FLEET ENEMA 7-19 GM/118ML RE ENEM: 1.0000 | ENEMA | Freq: Every day | RECTAL | Status: DC | PRN

## 2020-03-28 NOTE — Lactation Note (Signed)
This note was copied from a baby's chart. Lactation Consultation Note  Patient Name: Sandra Buchanan [id number] Date: 03/28/2020 Reason for consult: Initial assessment;Mother's request Baby has not fed since after delivery  Maternal Data Formula Feeding for Exclusion: No Has patient been taught Hand Expression?: Yes Does the patient have breastfeeding experience prior to this delivery?: Yes  Feeding Feeding Type: Breast Fed Few sucks at breast, 2 min, baby very fussy when attempting, curls tongue up and difficult to get nipple on tongue, but did begin to nurse once calmed enough to get nipple on top of  Tongue , came off breast when mom moved her hand behind baby's head and we were unable to get baby to latch again, she continued to sleep  LATCH Score Latch: Repeated attempts needed to sustain latch, nipple held in mouth throughout feeding, stimulation needed to elicit sucking reflex.  Audible Swallowing: None  Type of Nipple: Everted at rest and after stimulation  Comfort (Breast/Nipple): Soft / non-tender  Hold (Positioning): Assistance needed to correctly position infant at breast and maintain latch.  LATCH Score: 6  Interventions Interventions: Breast feeding basics reviewed;Assisted with latch;Skin to skin;Hand express;Breast compression;Adjust position;Support pillows  Lactation Tools Discussed/Used  BAby left skin to skin on mom's chest, mom encouraged to attempt when baby shows cues or at least in an hour mom  and informed what feeding cues were, baby had fast delivery, therefore stomach probably full of mucous and fluid, mom reassured, LC no written on white board Consult Status Consult Status: Follow-up Date: 03/29/20 Follow-up type: In-patient    [name] 03/28/2020, 5:27 PM

## 2020-03-28 NOTE — OB Triage Note (Signed)
Pt is a 29 yo, G2P1. Pt reports waking up from sleeping having contractions, however she does not know how far apart the contractions are at this time. Reports no LOF, no N/V/D, no bleeding.

## 2020-03-28 NOTE — Progress Notes (Signed)
RN at bedside to place IV at 0716 monitors adjusted at 0720. RN returned to desk to notify MD of patients request for pain medication. FOB called out at 0724 saying the "head is out". RN and several others to room where head was noted to be out to the chin. Patient instructed to push. Emergency button pushed. Baby delivered at 0725 LOA. Dr Valentino Saxon to bedside cord clamped. Dr Dalbert Garnet arrived to beside to deliver placenta.
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
## 2020-03-28 NOTE — Progress Notes (Signed)
Post Partum Day 0  Subjective: no complaints, up ad lib and voiding  Doing well, no concerns. Ambulating without difficulty, pain managed with PO meds, tolerating regular diet, and voiding without difficulty.   No fever/chills, chest pain, shortness of breath, nausea/vomiting, or leg pain. No nipple or breast pain. No headache, visual changes, or RUQ/epigastric pain.  Objective: BP 118/77 (BP Location: Left Arm)   Pulse 71   Temp 97.8 F (36.6 C) (Oral)   Resp 18   Ht 5\' 7"  (1.702 m)   Wt 90.3 kg   LMP 06/27/2019 (Exact Date)   SpO2 100%   Breastfeeding Unknown   BMI 31.17 kg/m    Physical Exam:  General: alert, cooperative and no distress Breasts: soft/nontender CV: RRR Pulm: nl effort, CTABL Abdomen: soft, non-tender, active bowel sounds Uterine Fundus: firm Perineum: minimal edema,repair well approximated Lochia: appropriate DVT Evaluation: No evidence of DVT seen on physical exam.  Recent Labs    03/28/20 0649  HGB 11.7*  HCT 33.6*  WBC 6.7  PLT 243    Assessment/Plan: 29 y.o. G2P2002 postpartum day # 0   -Continue routine postpartum care -Lactation consult PRN for breastfeeding  -Asymptomatic covid-19 positive, denies any current symptoms.  Will check CMP with CBC tomorrow morning.   -Immunization status: all immunizations up to date  Disposition: Continue inpatient postpartum care, plan for discharge tomorrow    LOS: 0 days   37, Gustavo Lah 03/28/2020, 6:39 PM   ----- 03/30/2020  Certified Nurse Midwife [facility]

## 2020-03-29 LAB — COMPREHENSIVE METABOLIC PANEL
ALT: 14 U/L (ref 0–44)
AST: 22 U/L (ref 15–41)
Albumin: 2.9 g/dL — ABNORMAL LOW (ref 3.5–5.0)
Alkaline Phosphatase: 88 U/L (ref 38–126)
Anion gap: 11 (ref 5–15)
BUN: 8 mg/dL (ref 6–20)
CO2: 21 mmol/L — ABNORMAL LOW (ref 22–32)
Calcium: 9.2 mg/dL (ref 8.9–10.3)
Chloride: 107 mmol/L (ref 98–111)
Creatinine, Ser: 0.43 mg/dL — ABNORMAL LOW (ref 0.44–1.00)
GFR calc Af Amer: >60 mL/min (ref >60)
GFR calc non Af Amer: >60 mL/min (ref >60)
Glucose, Bld: 90 mg/dL (ref 70–99)
Potassium: 3.7 mmol/L (ref 3.5–5.1)
Sodium: 139 mmol/L (ref 135–145)
Total Bilirubin: 0.4 mg/dL (ref 0.3–1.2)
Total Protein: 5.8 g/dL — ABNORMAL LOW (ref 6.5–8.1)

## 2020-03-29 LAB — CBC
HCT: 30.8 % — ABNORMAL LOW (ref 36.0–46.0)
Hemoglobin: 10.4 g/dL — ABNORMAL LOW (ref 12.0–15.0)
MCH: 26.6 pg (ref 26.0–34.0)
MCHC: 33.8 g/dL (ref 30.0–36.0)
MCV: 78.8 fL — ABNORMAL LOW (ref 80.0–100.0)
Platelets: 185 10*3/uL (ref 150–400)
RBC: 3.91 MIL/uL (ref 3.87–5.11)
RDW: 13.8 % (ref 11.5–15.5)
WBC: 6.2 10*3/uL (ref 4.0–10.5)
nRBC: 0 % (ref 0.0–0.2)

## 2020-03-29 MED ORDER — ACETAMINOPHEN 325 MG PO TABS: 650.0000 mg | ORAL_TABLET | ORAL | Status: DC | PRN

## 2020-03-29 MED ORDER — IBUPROFEN 600 MG PO TABS: 600.0000 mg | ORAL_TABLET | Freq: Four times a day (QID) | ORAL | 1 refills | Status: DC | PRN

## 2020-03-29 NOTE — Progress Notes (Signed)
Pt discharged with infant. Discharge instructions, prescriptions, and follow up appointments given to and reviewed with patient. Pt verbalized understanding. Escorted out by RN.  
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
## 2020-03-29 NOTE — Discharge Instructions (Signed)
Vaginal Delivery, Care After Refer to this sheet in the next few weeks. These discharge instructions provide you with information on caring for yourself after delivery. Your caregiver may also give you specific instructions. Your treatment has been planned according to the most current medical practices available, but problems sometimes occur. Call your caregiver if you have any problems or questions after you go home. HOME CARE INSTRUCTIONS 1. Take over-the-counter or prescription medicines only as directed by your caregiver or pharmacist. 2. Do not drink alcohol, especially if you are breastfeeding or taking medicine to relieve pain. 3. Do not smoke tobacco. 4. Continue to use good perineal care. Good perineal care includes: 1. Wiping your perineum from back to front 2. Keeping your perineum clean. 3. You can do sitz baths twice a day, to help keep this area clean 5. Do not use tampons, douche or have sex until your caregiver says it is okay. 6. Shower only and avoid sitting in submerged water, aside from sitz baths 7. Wear a well-fitting bra that provides breast support. 8. Eat healthy foods. 9. Drink enough fluids to keep your urine clear or pale yellow. 10. Eat high-fiber foods such as whole grain cereals and breads, brown rice, beans, and fresh fruits and vegetables every day. These foods may help prevent or relieve constipation. 11. Avoid constipation with high fiber foods or medications, such as miralax or metamucil 12. Follow your caregiver's recommendations regarding resumption of activities such as climbing stairs, driving, lifting, exercising, or traveling. 13. Talk to your caregiver about resuming sexual activities. Resumption of sexual activities is dependent upon your risk of infection, your rate of healing, and your comfort and desire to resume sexual activity. 14. Try to have someone help you with your household activities and your newborn for at least a few days after you leave  the hospital. 15. Rest as much as possible. Try to rest or take a nap when your newborn is sleeping. 16. Increase your activities gradually. 17. Keep all of your scheduled postpartum appointments. It is very important to keep your scheduled follow-up appointments. At these appointments, your caregiver will be checking to make sure that you are healing physically and emotionally. SEEK MEDICAL CARE IF:   You are passing large clots from your vagina. Save any clots to show your caregiver.  You have a foul smelling discharge from your vagina.  You have trouble urinating.  You are urinating frequently.  You have pain when you urinate.  You have a change in your bowel movements.  You have increasing redness, pain, or swelling near your vaginal incision (episiotomy) or vaginal tear.  You have pus draining from your episiotomy or vaginal tear.  Your episiotomy or vaginal tear is separating.  You have painful, hard, or reddened breasts.  You have a severe headache.  You have blurred vision or see spots.  You feel sad or depressed.  You have thoughts of hurting yourself or your newborn.  You have questions about your care, the care of your newborn, or medicines.  You are dizzy or light-headed.  You have a rash.  You have nausea or vomiting.  You were breastfeeding and have not had a menstrual period within 12 weeks after you stopped breastfeeding.  You are not breastfeeding and have not had a menstrual period by the 12th week after delivery.  You have a fever. SEEK IMMEDIATE MEDICAL CARE IF:   You have persistent pain.  You have chest pain.  You have shortness of breath.    You faint.  You have leg pain.  You have stomach pain.  Your vaginal bleeding saturates two or more sanitary pads in 1 hour. MAKE SURE YOU:   Understand these instructions.  Will watch your condition.  Will get help right away if you are not doing well or get worse. Document Released:  07/23/2000 Document Revised: 12/10/2013 Document Reviewed: 03/22/2012 [employer] Patient Information 2015 [place], [place]. This information is not intended to replace advice given to you by your health care provider. Make sure you discuss any questions you have with your health care provider.  Sitz Bath A sitz bath is a warm water bath taken in the sitting position. The water covers only the hips and butt (buttocks). We recommend using one that fits in the toilet, to help with ease of use and cleanliness. It may be used for either healing or cleaning purposes. Sitz baths are also used to relieve pain, itching, or muscle tightening (spasms). The water may contain medicine. Moist heat will help you heal and relax.  HOME CARE  Take 3 to 4 sitz baths a day. 18. Fill the bathtub half-full with warm water. 19. Sit in the water and open the drain a little. 20. Turn on the warm water to keep the tub half-full. Keep the water running constantly. 21. Soak in the water for 15 to 20 minutes. 22. After the sitz bath, pat the affected area dry. GET HELP RIGHT AWAY IF: You get worse instead of better. Stop the sitz baths if you get worse. MAKE SURE YOU:  Understand these instructions.  Will watch your condition.  Will get help right away if you are not doing well or get worse. Document Released: 09/02/2004 Document Revised: 04/19/2012 Document Reviewed: 11/23/2010 [facility] Patient Information 2015 [place], [place]. This information is not intended to replace advice given to you by your health care provider. Make sure you discuss any questions you have with your health care provider.   Postpartum Baby Blues The postpartum period begins right after the birth of a baby. During this time, there is often a lot of joy and excitement. It is also a time of many changes in the life of the parents. No matter how many times a mother gives birth, each child brings new challenges to the family, including different ways  of relating to one another. It is common to have feelings of excitement along with confusing changes in moods, emotions, and thoughts. You may feel happy one minute and sad or stressed the next. These feelings of sadness usually happen in the period right after you have your baby, and they go away within a week or two. This is called the "baby blues." What are the causes? There is no known cause of baby blues. It is likely caused by a combination of factors. However, changes in hormone levels after childbirth are believed to trigger some of the symptoms. Other factors that can play a role in these mood changes include:  Lack of sleep.  Stressful life events, such as poverty, caring for a loved one, or death of a loved one.  Genetics. What are the signs or symptoms? Symptoms of this condition include:  Brief changes in mood, such as going from extreme happiness to sadness.  Decreased concentration.  Difficulty sleeping.  Crying spells and tearfulness.  Loss of appetite.  Irritability.  Anxiety. If the symptoms of baby blues last for more than 2 weeks or become more severe, you may have postpartum depression. How is this diagnosed?  This condition is diagnosed based on an evaluation of your symptoms. There are no medical or lab tests that lead to a diagnosis, but there are various questionnaires that a health care provider may use to identify women with the baby blues or postpartum depression. How is this treated? Treatment is not needed for this condition. The baby blues usually go away on their own in 1-2 weeks. Social support is often all that is needed. You will be encouraged to get adequate sleep and rest. Follow these instructions at home: Lifestyle      Get as much rest as you can. Take a nap when the baby sleeps.  Exercise regularly as told by your health care provider. Some women find yoga and walking to be helpful.  Eat a balanced and nourishing diet. This includes  plenty of fruits and vegetables, whole grains, and lean proteins.  Do little things that you enjoy. Have a cup of tea, take a bubble bath, read your favorite magazine, or listen to your favorite music.  Avoid alcohol.  Ask for help with household chores, cooking, grocery shopping, or running errands. Do not try to do everything yourself. Consider hiring a postpartum doula to help. This is a professional who specializes in providing support to new mothers.  Try not to make any major life changes during pregnancy or right after giving birth. This can add stress. General instructions  Talk to people close to you about how you are feeling. Get support from your partner, family members, friends, or other new moms. You may want to join a support group.  Find ways to cope with stress. This may include: ? Writing your thoughts and feelings in a journal. ? Spending time outside. ? Spending time with people who make you laugh.  Try to stay positive in how you think. Think about the things you are grateful for.  Take over-the-counter and prescription medicines only as told by your health care provider.  Let your health care provider know if you have any concerns.  Keep all postpartum visits as told by your health care provider. This is important. Contact a health care provider if:  Your baby blues do not go away after 2 weeks. Get help right away if:  You have thoughts of taking your own life (suicidal thoughts).  You think you may harm the baby or other people.  You see or hear things that are not there (hallucinations). Summary  After giving birth, you may feel happy one minute and sad or stressed the next. Feelings of sadness that happen right after the baby is born and go away after a week or two are called the "baby blues."  You can manage the baby blues by getting enough rest, eating a healthy diet, exercising, spending time with supportive people, and finding ways to cope with  stress.  If feelings of sadness and stress last longer than 2 weeks or get in the way of caring for your baby, talk to your health care provider. This may mean you have postpartum depression. This information is not intended to replace advice given to you by your health care provider. Make sure you discuss any questions you have with your health care provider. Document Revised: 11/17/2018 Document Reviewed: 09/21/2016 [employer] Patient Education  2020 [employer]  Breastfeeding  Choosing to breastfeed is one of the best decisions you can make for yourself and your baby. A change in hormones during pregnancy causes your breasts to make breast milk in your milk-producing  glands. Hormones prevent breast milk from being released before your baby is born. They also prompt milk flow after birth. Once breastfeeding has begun, thoughts of your baby, as well as his or her sucking or crying, can stimulate the release of milk from your milk-producing glands. Benefits of breastfeeding Research shows that breastfeeding offers many health benefits for infants and mothers. It also offers a cost-free and convenient way to feed your baby. For your baby  Your first milk (colostrum) helps your baby's digestive system to function better.  Special cells in your milk (antibodies) help your baby to fight off infections.  Breastfed babies are less likely to develop asthma, allergies, obesity, or type 2 diabetes. They are also at lower risk for sudden infant death syndrome (SIDS).  Nutrients in breast milk are better able to meet your baby's needs compared to infant formula.  Breast milk improves your baby's brain development. For you  Breastfeeding helps to create a very special bond between you and your baby.  Breastfeeding is convenient. Breast milk costs nothing and is always available at the correct temperature.  Breastfeeding helps to burn calories. It helps you to lose the weight that you gained during  pregnancy.  Breastfeeding makes your uterus return faster to its size before pregnancy. It also slows bleeding (lochia) after you give birth.  Breastfeeding helps to lower your risk of developing type 2 diabetes, osteoporosis, rheumatoid arthritis, cardiovascular disease, and breast, ovarian, uterine, and endometrial cancer later in life. Breastfeeding basics Starting breastfeeding  Find a comfortable place to sit or lie down, with your neck and back well-supported.  Place a pillow or a rolled-up blanket under your baby to bring him or her to the level of your breast (if you are seated). Nursing pillows are specially designed to help support your arms and your baby while you breastfeed.  Make sure that your baby's tummy (abdomen) is facing your abdomen.  Gently massage your breast. With your fingertips, massage from the outer edges of your breast inward toward the nipple. This encourages milk flow. If your milk flows slowly, you may need to continue this action during the feeding.  Support your breast with 4 fingers underneath and your thumb above your nipple (make the letter "C" with your hand). Make sure your fingers are well away from your nipple and your baby's mouth.  Stroke your baby's lips gently with your finger or nipple.  When your baby's mouth is open wide enough, quickly bring your baby to your breast, placing your entire nipple and as much of the areola as possible into your baby's mouth. The areola is the colored area around your nipple. ? More areola should be visible above your baby's upper lip than below the lower lip. ? Your baby's lips should be opened and extended outward (flanged) to ensure an adequate, comfortable latch. ? Your baby's tongue should be between his or her lower gum and your breast.  Make sure that your baby's mouth is correctly positioned around your nipple (latched). Your baby's lips should create a seal on your breast and be turned out (everted).  It  is common for your baby to suck about 2-3 minutes in order to start the flow of breast milk. Latching Teaching your baby how to latch onto your breast properly is very important. An improper latch can cause nipple pain, decreased milk supply, and poor weight gain in your baby. Also, if your baby is not latched onto your nipple properly, he or she may  swallow some air during feeding. This can make your baby fussy. Burping your baby when you switch breasts during the feeding can help to get rid of the air. However, teaching your baby to latch on properly is still the best way to prevent fussiness from swallowing air while breastfeeding. Signs that your baby has successfully latched onto your nipple  Silent tugging or silent sucking, without causing you pain. Infant's lips should be extended outward (flanged).  Swallowing heard between every 3-4 sucks once your milk has started to flow (after your let-down milk reflex occurs).  Muscle movement above and in front of his or her ears while sucking. Signs that your baby has not successfully latched onto your nipple  Sucking sounds or smacking sounds from your baby while breastfeeding.  Nipple pain. If you think your baby has not latched on correctly, slip your finger into the corner of your baby's mouth to break the suction and place it between your baby's gums. Attempt to start breastfeeding again. Signs of successful breastfeeding Signs from your baby  Your baby will gradually decrease the number of sucks or will completely stop sucking.  Your baby will fall asleep.  Your baby's body will relax.  Your baby will retain a small amount of milk in his or her mouth.  Your baby will let go of your breast by himself or herself. Signs from you  Breasts that have increased in firmness, weight, and size 1-3 hours after feeding.  Breasts that are softer immediately after breastfeeding.  Increased milk volume, as well as a change in milk consistency  and color by the fifth day of breastfeeding.  Nipples that are not sore, cracked, or bleeding. Signs that your baby is getting enough milk  Wetting at least 1-2 diapers during the first 24 hours after birth.  Wetting at least 5-6 diapers every 24 hours for the first week after birth. The urine should be clear or pale yellow by the age of 5 days.  Wetting 6-8 diapers every 24 hours as your baby continues to grow and develop.  At least 3 stools in a 24-hour period by the age of 5 days. The stool should be soft and yellow.  At least 3 stools in a 24-hour period by the age of 7 days. The stool should be seedy and yellow.  No loss of weight greater than 10% of birth weight during the first 3 days of life.  Average weight gain of 4-7 oz (113-198 g) per week after the age of 4 days.  Consistent daily weight gain by the age of 5 days, without weight loss after the age of 2 weeks. After a feeding, your baby may spit up a small amount of milk. This is normal. Breastfeeding frequency and duration Frequent feeding will help you make more milk and can prevent sore nipples and extremely full breasts (breast engorgement). Breastfeed when you feel the need to reduce the fullness of your breasts or when your baby shows signs of hunger. This is called "breastfeeding on demand." Signs that your baby is hungry include:  Increased alertness, activity, or restlessness.  Movement of the head from side to side.  Opening of the mouth when the corner of the mouth or cheek is stroked (rooting).  Increased sucking sounds, smacking lips, cooing, sighing, or squeaking.  Hand-to-mouth movements and sucking on fingers or hands.  Fussing or crying. Avoid introducing a pacifier to your baby in the first 4-6 weeks after your baby is born. After this  time, you may choose to use a pacifier. Research has shown that pacifier use during the first year of a baby's life decreases the risk of sudden infant death syndrome  (SIDS). Allow your baby to feed on each breast as long as he or she wants. When your baby unlatches or falls asleep while feeding from the first breast, offer the second breast. Because newborns are often sleepy in the first few weeks of life, you may need to awaken your baby to get him or her to feed. Breastfeeding times will vary from baby to baby. However, the following rules can serve as a guide to help you make sure that your baby is properly fed:  Newborns (babies 79 weeks of age or younger) may breastfeed every 1-3 hours.  Newborns should not go without breastfeeding for longer than 3 hours during the day or 5 hours during the night.  You should breastfeed your baby a minimum of 8 times in a 24-hour period. Breast milk pumping     Pumping and storing breast milk allows you to make sure that your baby is exclusively fed your breast milk, even at times when you are unable to breastfeed. This is especially important if you go back to work while you are still breastfeeding, or if you are not able to be present during feedings. Your lactation consultant can help you find a method of pumping that works best for you and give you guidelines about how long it is safe to store breast milk. Caring for your breasts while you breastfeed Nipples can become dry, cracked, and sore while breastfeeding. The following recommendations can help keep your breasts moisturized and healthy:  Avoid using soap on your nipples.  Wear a supportive bra designed especially for nursing. Avoid wearing underwire-style bras or extremely tight bras (sports bras).  Air-dry your nipples for 3-4 minutes after each feeding.  Use only cotton bra pads to absorb leaked breast milk. Leaking of breast milk between feedings is normal.  Use lanolin on your nipples after breastfeeding. Lanolin helps to maintain your skin's normal moisture barrier. Pure lanolin is not harmful (not toxic) to your baby. You may also hand express a few  drops of breast milk and gently massage that milk into your nipples and allow the milk to air-dry. In the first few weeks after giving birth, some women experience breast engorgement. Engorgement can make your breasts feel heavy, warm, and tender to the touch. Engorgement peaks within 3-5 days after you give birth. The following recommendations can help to ease engorgement:  Completely empty your breasts while breastfeeding or pumping. You may want to start by applying warm, moist heat (in the shower or with warm, water-soaked hand towels) just before feeding or pumping. This increases circulation and helps the milk flow. If your baby does not completely empty your breasts while breastfeeding, pump any extra milk after he or she is finished.  Apply ice packs to your breasts immediately after breastfeeding or pumping, unless this is too uncomfortable for you. To do this: ? Put ice in a plastic bag. ? Place a towel between your skin and the bag. ? Leave the ice on for 20 minutes, 2-3 times a day.  Make sure that your baby is latched on and positioned properly while breastfeeding. If engorgement persists after 48 hours of following these recommendations, contact your health care provider or a Advertising copywriter. Overall health care recommendations while breastfeeding  Eat 3 healthy meals and 3 snacks every day. Well-nourished  mothers who are breastfeeding need an additional 450-500 calories a day. You can meet this requirement by increasing the amount of a balanced diet that you eat.  Drink enough water to keep your urine pale yellow or clear.  Rest often, relax, and continue to take your prenatal vitamins to prevent fatigue, stress, and low vitamin and mineral levels in your body (nutrient deficiencies).  Do not use any products that contain nicotine or tobacco, such as cigarettes and e-cigarettes. Your baby may be harmed by chemicals from cigarettes that pass into breast milk and exposure to  secondhand smoke. If you need help quitting, ask your health care provider.  Avoid alcohol.  Do not use illegal drugs or marijuana.  Talk with your health care provider before taking any medicines. These include over-the-counter and prescription medicines as well as vitamins and herbal supplements. Some medicines that may be harmful to your baby can pass through breast milk.  It is possible to become pregnant while breastfeeding. If birth control is desired, ask your health care provider about options that will be safe while breastfeeding your baby. Where to find more information: [employer]: www.llli.org Contact a health care provider if:  You feel like you want to stop breastfeeding or have become frustrated with breastfeeding.  Your nipples are cracked or bleeding.  Your breasts are red, tender, or warm.  You have: ? Painful breasts or nipples. ? A swollen area on either breast. ? A fever or chills. ? Nausea or vomiting. ? Drainage other than breast milk from your nipples.  Your breasts do not become full before feedings by the fifth day after you give birth.  You feel sad and depressed.  Your baby is: ? Too sleepy to eat well. ? Having trouble sleeping. ? More than 70 week old and wetting fewer than 6 diapers in a 24-hour period. ? Not gaining weight by 44 days of age.  Your baby has fewer than 3 stools in a 24-hour period.  Your baby's skin or the white parts of his or her eyes become yellow. Get help right away if:  Your baby is overly tired (lethargic) and does not want to wake up and feed.  Your baby develops an unexplained fever. Summary  Breastfeeding offers many health benefits for infant and mothers.  Try to breastfeed your infant when he or she shows early signs of hunger.  Gently tickle or stroke your baby's lips with your finger or nipple to allow the baby to open his or her mouth. Bring the baby to your breast. Make sure that much of  the areola is in your baby's mouth. Offer one side and burp the baby before you offer the other side.  Talk with your health care provider or lactation consultant if you have questions or you face problems as you breastfeed. This information is not intended to replace advice given to you by your health care provider. Make sure you discuss any questions you have with your health care provider. Document Revised: 10/20/2017 Document Reviewed: 08/27/2016 Elsevier Patient Education  2020 [employer].

## 2020-03-31 DIAGNOSIS — Z20822 Contact with and (suspected) exposure to covid-19: Secondary | ICD-10-CM | POA: Diagnosis not present

## 2020-03-31 LAB — RPR: RPR Ser Ql: NONREACTIVE — AB

## 2020-04-01 ENCOUNTER — Telehealth: Payer: Self-pay | Admitting: *Deleted

## 2020-04-01 LAB — SURGICAL PATHOLOGY

## 2020-04-01 NOTE — Telephone Encounter (Signed)
Medicaid Managed Care team Transition of Care Assessment outreach attempt #1 made today. Unable to reach patient. HIPPA compliant voice message left requesting a return call. The patient has also been enrolled in an automated discharge follow up call series and will receive two outreach attempts for transition of care assessment. Contact information has been left for the patient and the Medicaid Managed Care team is available to provide assistance to the patient at any time.  ° °Katrice Monroe Toure, RN, BSN, CCRN °Patient Engagement Center °[phone] ° °

## 2020-04-02 ENCOUNTER — Inpatient Hospital Stay (HOSPITAL_COMMUNITY): Admission: RE | Admit: 2020-04-02 | Payer: Medicaid Other | Source: Home / Self Care

## 2020-04-09 DIAGNOSIS — Z419 Encounter for procedure for purposes other than remedying health state, unspecified: Secondary | ICD-10-CM | POA: Diagnosis not present

## 2020-05-09 DIAGNOSIS — Z419 Encounter for procedure for purposes other than remedying health state, unspecified: Secondary | ICD-10-CM | POA: Diagnosis not present

## 2020-05-23 DIAGNOSIS — Z1332 Encounter for screening for maternal depression: Secondary | ICD-10-CM | POA: Diagnosis not present

## 2020-05-23 DIAGNOSIS — Z3009 Encounter for other general counseling and advice on contraception: Secondary | ICD-10-CM | POA: Diagnosis not present

## 2020-06-09 DIAGNOSIS — Z419 Encounter for procedure for purposes other than remedying health state, unspecified: Secondary | ICD-10-CM | POA: Diagnosis not present

## 2020-07-02 DIAGNOSIS — M79641 Pain in right hand: Secondary | ICD-10-CM | POA: Diagnosis not present

## 2020-07-09 DIAGNOSIS — Z419 Encounter for procedure for purposes other than remedying health state, unspecified: Secondary | ICD-10-CM | POA: Diagnosis not present

## 2020-08-09 DIAGNOSIS — Z419 Encounter for procedure for purposes other than remedying health state, unspecified: Secondary | ICD-10-CM | POA: Diagnosis not present

## 2020-09-09 DIAGNOSIS — Z419 Encounter for procedure for purposes other than remedying health state, unspecified: Secondary | ICD-10-CM | POA: Diagnosis not present

## 2020-10-07 DIAGNOSIS — Z419 Encounter for procedure for purposes other than remedying health state, unspecified: Secondary | ICD-10-CM | POA: Diagnosis not present

## 2020-11-07 DIAGNOSIS — Z419 Encounter for procedure for purposes other than remedying health state, unspecified: Secondary | ICD-10-CM | POA: Diagnosis not present

## 2020-12-07 DIAGNOSIS — Z419 Encounter for procedure for purposes other than remedying health state, unspecified: Secondary | ICD-10-CM | POA: Diagnosis not present

## 2021-01-07 DIAGNOSIS — Z419 Encounter for procedure for purposes other than remedying health state, unspecified: Secondary | ICD-10-CM | POA: Diagnosis not present

## 2021-02-06 DIAGNOSIS — Z419 Encounter for procedure for purposes other than remedying health state, unspecified: Secondary | ICD-10-CM | POA: Diagnosis not present

## 2021-03-09 DIAGNOSIS — Z419 Encounter for procedure for purposes other than remedying health state, unspecified: Secondary | ICD-10-CM | POA: Diagnosis not present

## 2021-04-09 DIAGNOSIS — Z419 Encounter for procedure for purposes other than remedying health state, unspecified: Secondary | ICD-10-CM | POA: Diagnosis not present

## 2021-05-09 DIAGNOSIS — Z419 Encounter for procedure for purposes other than remedying health state, unspecified: Secondary | ICD-10-CM | POA: Diagnosis not present

## 2021-06-01 DIAGNOSIS — Z113 Encounter for screening for infections with a predominantly sexual mode of transmission: Secondary | ICD-10-CM | POA: Diagnosis not present

## 2021-06-01 DIAGNOSIS — R300 Dysuria: Secondary | ICD-10-CM | POA: Diagnosis not present

## 2021-06-01 DIAGNOSIS — N72 Inflammatory disease of cervix uteri: Secondary | ICD-10-CM | POA: Diagnosis not present

## 2021-06-01 DIAGNOSIS — Z3202 Encounter for pregnancy test, result negative: Secondary | ICD-10-CM | POA: Diagnosis not present

## 2021-06-01 DIAGNOSIS — N898 Other specified noninflammatory disorders of vagina: Secondary | ICD-10-CM | POA: Diagnosis not present

## 2021-06-09 DIAGNOSIS — Z419 Encounter for procedure for purposes other than remedying health state, unspecified: Secondary | ICD-10-CM | POA: Diagnosis not present

## 2021-06-12 DIAGNOSIS — Z3009 Encounter for other general counseling and advice on contraception: Secondary | ICD-10-CM | POA: Diagnosis not present

## 2021-07-09 DIAGNOSIS — Z419 Encounter for procedure for purposes other than remedying health state, unspecified: Secondary | ICD-10-CM | POA: Diagnosis not present

## 2021-08-09 DIAGNOSIS — Z419 Encounter for procedure for purposes other than remedying health state, unspecified: Secondary | ICD-10-CM | POA: Diagnosis not present

## 2021-08-14 DIAGNOSIS — Z113 Encounter for screening for infections with a predominantly sexual mode of transmission: Secondary | ICD-10-CM | POA: Diagnosis not present

## 2021-08-14 DIAGNOSIS — Z30430 Encounter for insertion of intrauterine contraceptive device: Secondary | ICD-10-CM | POA: Diagnosis not present

## 2021-09-08 DIAGNOSIS — M25572 Pain in left ankle and joints of left foot: Secondary | ICD-10-CM | POA: Diagnosis not present

## 2021-09-08 DIAGNOSIS — M7989 Other specified soft tissue disorders: Secondary | ICD-10-CM | POA: Diagnosis not present

## 2021-09-08 DIAGNOSIS — S99912A Unspecified injury of left ankle, initial encounter: Secondary | ICD-10-CM | POA: Diagnosis not present

## 2021-09-08 DIAGNOSIS — S99922A Unspecified injury of left foot, initial encounter: Secondary | ICD-10-CM | POA: Diagnosis not present

## 2021-09-09 DIAGNOSIS — Z419 Encounter for procedure for purposes other than remedying health state, unspecified: Secondary | ICD-10-CM | POA: Diagnosis not present

## 2021-10-07 DIAGNOSIS — Z419 Encounter for procedure for purposes other than remedying health state, unspecified: Secondary | ICD-10-CM | POA: Diagnosis not present

## 2021-10-24 DIAGNOSIS — B9689 Other specified bacterial agents as the cause of diseases classified elsewhere: Secondary | ICD-10-CM | POA: Diagnosis not present

## 2021-10-24 DIAGNOSIS — H109 Unspecified conjunctivitis: Secondary | ICD-10-CM | POA: Diagnosis not present

## 2021-11-07 DIAGNOSIS — Z419 Encounter for procedure for purposes other than remedying health state, unspecified: Secondary | ICD-10-CM | POA: Diagnosis not present

## 2021-12-04 DIAGNOSIS — Z113 Encounter for screening for infections with a predominantly sexual mode of transmission: Secondary | ICD-10-CM | POA: Diagnosis not present

## 2021-12-04 DIAGNOSIS — T8384XA Pain from genitourinary prosthetic devices, implants and grafts, initial encounter: Secondary | ICD-10-CM | POA: Diagnosis not present

## 2021-12-04 DIAGNOSIS — N921 Excessive and frequent menstruation with irregular cycle: Secondary | ICD-10-CM | POA: Diagnosis not present

## 2021-12-04 DIAGNOSIS — N898 Other specified noninflammatory disorders of vagina: Secondary | ICD-10-CM | POA: Diagnosis not present

## 2021-12-07 DIAGNOSIS — Z419 Encounter for procedure for purposes other than remedying health state, unspecified: Secondary | ICD-10-CM | POA: Diagnosis not present

## 2021-12-18 ENCOUNTER — Other Ambulatory Visit: Payer: Self-pay

## 2021-12-18 DIAGNOSIS — N63 Unspecified lump in unspecified breast: Secondary | ICD-10-CM

## 2021-12-18 DIAGNOSIS — N644 Mastodynia: Secondary | ICD-10-CM | POA: Diagnosis not present

## 2021-12-18 DIAGNOSIS — N6322 Unspecified lump in the left breast, upper inner quadrant: Secondary | ICD-10-CM | POA: Diagnosis not present

## 2021-12-22 DIAGNOSIS — T8384XA Pain from genitourinary prosthetic devices, implants and grafts, initial encounter: Secondary | ICD-10-CM | POA: Diagnosis not present

## 2021-12-22 DIAGNOSIS — T8384XD Pain from genitourinary prosthetic devices, implants and grafts, subsequent encounter: Secondary | ICD-10-CM | POA: Diagnosis not present

## 2022-01-07 DIAGNOSIS — Z419 Encounter for procedure for purposes other than remedying health state, unspecified: Secondary | ICD-10-CM | POA: Diagnosis not present

## 2022-01-08 ENCOUNTER — Ambulatory Visit
Admission: RE | Admit: 2022-01-08 | Discharge: 2022-01-08 | Disposition: A | Discharge status: Home / Self Care | Payer: Medicaid Other | Source: Ambulatory Visit

## 2022-01-08 ENCOUNTER — Encounter: Payer: Self-pay | Admitting: Radiology

## 2022-01-08 DIAGNOSIS — N644 Mastodynia: Secondary | ICD-10-CM | POA: Diagnosis not present

## 2022-01-08 DIAGNOSIS — N63 Unspecified lump in unspecified breast: Secondary | ICD-10-CM

## 2022-01-08 DIAGNOSIS — N6322 Unspecified lump in the left breast, upper inner quadrant: Secondary | ICD-10-CM

## 2022-01-08 DIAGNOSIS — N6452 Nipple discharge: Secondary | ICD-10-CM | POA: Diagnosis not present

## 2022-02-03 ENCOUNTER — Encounter: Payer: Self-pay | Admitting: Licensed Clinical Social Worker

## 2022-02-03 ENCOUNTER — Inpatient Hospital Stay: Payer: Medicaid Other

## 2022-02-03 ENCOUNTER — Inpatient Hospital Stay: Payer: Medicaid Other | Attending: Oncology | Admitting: Licensed Clinical Social Worker

## 2022-02-03 DIAGNOSIS — Z87891 Personal history of nicotine dependence: Secondary | ICD-10-CM | POA: Diagnosis not present

## 2022-02-03 DIAGNOSIS — Z803 Family history of malignant neoplasm of breast: Secondary | ICD-10-CM | POA: Diagnosis not present

## 2022-02-03 NOTE — Progress Notes (Signed)
REFERRING PROVIDER: Minda Meo, CNM [address] [place],  [place] [address]  PRIMARY PROVIDER:  Patient, No Pcp Per  PRIMARY REASON FOR VISIT:  1. Family history of breast cancer      HISTORY OF PRESENT ILLNESS:   Ms. Hamric, a 31 y.o. female, was seen for a [facility] cancer genetics consultation at the request of Dr. Ardelle Lesches due to a family history of breast cancer.  Ms. Eye presents to clinic today to discuss the possibility of a hereditary predisposition to cancer, genetic testing, and to further clarify her future cancer risks, as well as potential cancer risks for family members.   CANCER HISTORY:  Ms. Maniaci is a 31 y.o. female with no personal history of cancer.     RISK FACTORS:  Menarche was at age 23-12.  First live birth at age 52.  OCP use for several years. Ovaries intact: yes.  Hysterectomy: no.  Menopausal status: premenopausal.  HRT use: 0 years. Colonoscopy: no; not examined. Mammogram within the last year: no; has had one in the past Number of breast biopsies: 0. Up to date with pelvic exams: yes.  Past Medical History:  Diagnosis Date   History of gestational hypertension     Past Surgical History:  Procedure Laterality Date   HAND SURGERY Right 2008   tumor    Social History   Socioeconomic History   Marital status: Single    Spouse name: Legrand Como    Number of children: Not on file   Years of education: Not on file   Highest education level: Not on file  Occupational History   Not on file  Tobacco Use   Smoking status: Former   Smokeless tobacco: Never  Vaping Use   Vaping Use: Former  Substance and Sexual Activity   Alcohol use: Never   Drug use: Yes    Types: Marijuana    Comment: unsure of last use   Sexual activity: Yes    Birth control/protection: Surgical    Comment: vasectomy  Other Topics Concern   Not on file  Social History Narrative   Not on file   Social Determinants of Health   Financial Resource  Strain: Not on file  Food Insecurity: Not on file  Transportation Needs: Not on file  Physical Activity: Not on file  Stress: Not on file  Social Connections: Not on file     FAMILY HISTORY:  We obtained a detailed, 4-generation family history.  Significant diagnoses are listed below: Family History  Problem Relation Age of Onset   Breast cancer Maternal Aunt 80   Ms. Bringle has 2 daughters. She has one maternal half brother.  Ms. Marasco mother is living at 38 with no history of cancer. Patient has 2 maternal aunts. One currently has breast cancer at 84. She has had genetic testing but did not share result. Maternal grandmother has had skin cancer. Grandfather passed at a young age, no cancers.  Ms. Gajda has limited information about her paternal side. Her father is from [place] and passed at a young age. No known cancers on this side.   Ms. Sayegh is aware of previous family history of genetic testing for hereditary cancer risks. Patient had ancestry DNA testing which showed Ash[place]azi [place] ancestry as well as [place] and [place], [place], [place], [place], [place].There is no known consanguinity.    GENETIC COUNSELING ASSESSMENT: Ms. Common is a 31 y.o. female with a family history of breast cancer and Ashkenazi Jewish ancestry  which is somewhat suggestive of a hereditary cancer syndrome and predisposition to cancer. We, therefore, discussed and recommended the following at today's visit.   DISCUSSION: We discussed that approximately 10% of breast cancer is hereditary. Most cases of hereditary breast cancer are associated with BRCA1/BRCA2 genes. People of [place] descent have a higher chance to have a mutation in one of these genes. There are other genes associated with hereditary cancer as well. Cancers and risks are gene specific. We discussed that testing is beneficial for several reasons including knowing about cancer risks, identifying potential screening  and risk-reduction options that may be appropriate, and to understand if other family members could be at risk for cancer and allow them to undergo genetic testing.   We reviewed the characteristics, features and inheritance patterns of hereditary cancer syndromes. We also discussed genetic testing, including the appropriate family members to test, the process of testing, insurance coverage and turn-around-time for results. We discussed the implications of a negative, positive and/or variant of uncertain significant result. We recommended Ms. Mellone pursue genetic testing for the [facility] Multi-Cancer+RNA gene panel.   The Multi-Cancer Panel + RNA offered by [facility] includes sequencing and/or deletion duplication testing of the following 84 genes: AIP, ALK, APC, ATM, AXIN2,BAP1,  BARD1, BLM, BMPR1A, BRCA1, BRCA2, BRIP1, CASR, CDC73, CDH1, CDK4, CDKN1B, CDKN1C, CDKN2A (p14ARF), CDKN2A (p16INK4a), CEBPA, CHEK2, CTNNA1, DICER1, DIS3L2, EGFR (c.2369C>T, p.Thr790Met variant only), EPCAM (Deletion/duplication testing only), FH, FLCN, GATA2, GPC3, GREM1 (Promoter region deletion/duplication testing only), HOXB13 (c.251G>A, p.Gly84Glu), HRAS, KIT, MAX, MEN1, MET, MITF (c.952G>A, p.Glu318Lys variant only), MLH1, MSH2, MSH3, MSH6, MUTYH, NBN, NF1, NF2, NTHL1, PALB2, PDGFRA, PHOX2B, PMS2, POLD1, POLE, POT1, PRKAR1A, PTCH1, PTEN, RAD50, RAD51C, RAD51D, RB1, RECQL4, RET, RUNX1, SDHAF2, SDHA (sequence changes only), SDHB, SDHC, SDHD, SMAD4, SMARCA4, SMARCB1, SMARCE1, STK11, SUFU, TERC, TERT, TMEM127, TP53, TSC1, TSC2, VHL, WRN and WT1.  Based on Ms. Darius's family history of cancer, she meets medical criteria for genetic testing. Despite that she meets criteria, she may still have an out of pocket cost. We discussed that if her out of pocket cost for testing is over $100, the laboratory will call and confirm whether she wants to proceed with testing.  If the out of pocket cost of testing is less than $100 she will be billed  by the genetic testing laboratory.   PLAN: After considering the risks, benefits, and limitations, Ms. Robley provided informed consent to pursue genetic testing and the blood sample was sent to [facility] for analysis of the Multi-Cancer+RNA panel. Results should be available within approximately 2-3 weeks' time, at which point they will be disclosed by telephone to Ms. Cambre, as will any additional recommendations warranted by these results. Ms. Fenster will receive a summary of her genetic counseling visit and a copy of her results once available. This information will also be available in Epic.   Ms. Holloman questions were answered to her satisfaction today. Our contact information was provided should additional questions or concerns arise. Thank you for the referral and allowing [place] to share in the care of your patient.   Faith Rogue, MS, [facility] Genetic Counselor [email or web address] Phone: [phone]  The patient was seen for a total of 25 minutes in face-to-face genetic counseling.  Dr. Grayland Ormond was available for discussion regarding this case.   _______________________________________________________________________ For Office Staff:  Number of people involved in session: 1 Was an Intern/ student involved with case: no

## 2022-02-04 DIAGNOSIS — J0100 Acute maxillary sinusitis, unspecified: Secondary | ICD-10-CM | POA: Diagnosis not present

## 2022-02-04 DIAGNOSIS — R059 Cough, unspecified: Secondary | ICD-10-CM | POA: Diagnosis not present

## 2022-02-04 DIAGNOSIS — J209 Acute bronchitis, unspecified: Secondary | ICD-10-CM | POA: Diagnosis not present

## 2022-02-06 DIAGNOSIS — Z419 Encounter for procedure for purposes other than remedying health state, unspecified: Secondary | ICD-10-CM | POA: Diagnosis not present

## 2022-02-23 ENCOUNTER — Encounter: Payer: Self-pay | Admitting: Licensed Clinical Social Worker

## 2022-02-23 ENCOUNTER — Telehealth: Payer: Self-pay | Admitting: Licensed Clinical Social Worker

## 2022-02-23 ENCOUNTER — Ambulatory Visit: Payer: Self-pay | Admitting: Licensed Clinical Social Worker

## 2022-02-23 DIAGNOSIS — Z1379 Encounter for other screening for genetic and chromosomal anomalies: Secondary | ICD-10-CM

## 2022-02-23 NOTE — Telephone Encounter (Signed)
Revealed negative genetic testing.  Revealed that a VUS in BMPR1A and PDGFRA was identified. This normal result is reassuring.  It is unlikely that there is an increased risk of cancer due to a mutation in one of these genes.  However, genetic testing is not perfect, and cannot definitively rule out a hereditary cause.  It will be important for her to keep in contact with genetics to learn if any additional testing may be needed in the future.     

## 2022-02-23 NOTE — Progress Notes (Signed)
HPI:  Sandra Buchanan was previously seen in the [facility] clinic due to a family history of cancer and concerns regarding a hereditary predisposition to cancer. Please refer to our prior cancer genetics clinic note for more information regarding our discussion, assessment and recommendations, at the time. Sandra Buchanan recent genetic test results were disclosed to her, as were recommendations warranted by these results. These results and recommendations are discussed in more detail below.  CANCER HISTORY:  Oncology History   No history exists.    FAMILY HISTORY:  We obtained a detailed, 4-generation family history.  Significant diagnoses are listed below: Family History  Problem Relation Age of Onset   Breast cancer Maternal Aunt 93    Sandra Buchanan has 2 daughters. She has one maternal half brother.   Sandra Buchanan mother is living at 65 with no history of cancer. Patient has 2 maternal aunts. One currently has breast cancer at 76. She has had genetic testing but did not share result. Maternal grandmother has had skin cancer. Grandfather passed at a young age, no cancers.   Sandra Buchanan has limited information about her paternal side. Her father is from [place] and passed at a young age. No known cancers on this side.    Sandra Buchanan is aware of previous family history of genetic testing for hereditary cancer risks. Patient had ancestry DNA testing which showed Ashkenazi [place] ancestry as well as [place] and [place], [place], [place], [place], [place].There is no known consanguinity.     GENETIC TEST RESULTS: Genetic testing reported out on 02/19/2022 through the [facility] Multi-Cancer+RNA cancer panel found no pathogenic mutations.   The Multi-Cancer Panel + RNA offered by [facility] includes sequencing and/or deletion duplication testing of the following 84 genes: AIP, ALK, APC, ATM, AXIN2,BAP1,  BARD1, BLM, BMPR1A, BRCA1, BRCA2, BRIP1, CASR, CDC73, CDH1, CDK4, CDKN1B,  CDKN1C, CDKN2A (p14ARF), CDKN2A (p16INK4a), CEBPA, CHEK2, CTNNA1, DICER1, DIS3L2, EGFR (c.2369C>T, p.Thr790Met variant only), EPCAM (Deletion/duplication testing only), FH, FLCN, GATA2, GPC3, GREM1 (Promoter region deletion/duplication testing only), HOXB13 (c.251G>A, p.Gly84Glu), HRAS, KIT, MAX, MEN1, MET, MITF (c.952G>A, p.Glu318Lys variant only), MLH1, MSH2, MSH3, MSH6, MUTYH, NBN, NF1, NF2, NTHL1, PALB2, PDGFRA, PHOX2B, PMS2, POLD1, POLE, POT1, PRKAR1A, PTCH1, PTEN, RAD50, RAD51C, RAD51D, RB1, RECQL4, RET, RUNX1, SDHAF2, SDHA (sequence changes only), SDHB, SDHC, SDHD, SMAD4, SMARCA4, SMARCB1, SMARCE1, STK11, SUFU, TERC, TERT, TMEM127, TP53, TSC1, TSC2, VHL, WRN and WT1.   The test report has been scanned into EPIC and is located under the Molecular Pathology section of the Results Review tab.  A portion of the result report is included below for reference.     We discussed that because current genetic testing is not perfect, it is possible there may be a gene mutation in one of these genes that current testing cannot detect, but that chance is small.  There could be another gene that has not yet been discovered, or that we have not yet tested, that is responsible for the cancer diagnoses in the family. It is also possible there is a hereditary cause for the cancer in the family that [name] did not inherit and therefore was not identified in her testing.  Therefore, it is important to remain in touch with cancer genetics in the future so that we can continue to offer Sandra Buchanan the most up to date genetic testing.   Genetic testing did identify 2 Variants of uncertain significance (VUS) - one in the BMPR1A gene, a second in the PDGFRA gene. At this  time, it is unknown if these variants are associated with increased cancer risk or if they are normal findings, but most variants such as these get reclassified to being inconsequential. They should not be used to make medical management decisions. With  time, we suspect the lab will determine the significance of these variants, if any. If we do learn more about them, we will try to contact Sandra Buchanan to discuss it further. However, it is important to stay in touch with [place] periodically and keep the address and phone number up to date.  ADDITIONAL GENETIC TESTING: We discussed with Sandra Buchanan that her genetic testing was fairly extensive.  If there are genes identified to increase cancer risk that can be analyzed in the future, we would be happy to discuss and coordinate this testing at that time.    CANCER SCREENING RECOMMENDATIONS: [name] test result is considered negative (normal).  This means that we have not identified a hereditary cause for her family history of cancer at this time.   While reassuring, this does not definitively rule out a hereditary predisposition to cancer. It is still possible that there could be genetic mutations that are undetectable by current technology. There could be genetic mutations in genes that have not been tested or identified to increase cancer risk.  Therefore, it is recommended she continue to follow the cancer management and screening guidelines provided by her primary healthcare provider.   An individual's cancer risk and medical management are not determined by genetic test results alone. Overall cancer risk assessment incorporates additional factors, including personal medical history, family history, and any available genetic information that may result in a personalized plan for cancer prevention and surveillance.   Based on Sandra Buchanan's personal and family history of cancer as well as her genetic test results, risk model Sandra Buchanan was used to estimate her risk of developing breast cancer. This estimates her lifetime risk of developing breast cancer to be approximately 17%.  The patient's lifetime breast cancer risk is a preliminary estimate based on available information using one of several models  endorsed by the [employer]  (NCCN). The NCCN recommends consideration of breast MRI screening as an adjunct to mammography for patients at high risk (defined as 20% or greater lifetime risk).  This risk estimate can change over time and may be repeated to reflect new information in her personal or family history in the future.   RECOMMENDATIONS FOR FAMILY MEMBERS:  Relatives in this family might be at some increased risk of developing cancer, over the general population risk, simply due to the family history of cancer.  We recommended female relatives in this family have a yearly mammogram beginning at age 50, or 20 years younger than the earliest onset of cancer, an annual clinical breast exam, and perform monthly breast self-exams. Female relatives in this family should also have a gynecological exam as recommended by their primary provider.  All family members should be referred for colonoscopy starting at age 24.    It is also possible there is a hereditary cause for the cancer in Ms. Horen's family that she did not inherit and therefore was not identified in her.  Based on Ms. Caradine's family history, we recommended  her aunt with breast cancer have genetic counseling and testing if she has not. Ms. Dikes will let us know if we can be of any assistance in coordinating genetic counseling and/or testing for these family members.  FOLLOW-UP: Lastly, we discussed with  Ms. Ruda that cancer genetics is a rapidly advancing field and it is possible that new genetic tests will be appropriate for her and/or her family members in the future. We encouraged her to remain in contact with cancer genetics on an annual basis so we can update her personal and family histories and let her know of advances in cancer genetics that may benefit this family.   Our contact number was provided. Ms. Werden questions were answered to her satisfaction, and she knows she is welcome to call us at  anytime with additional questions or concerns.   Faith Rogue, MS, [facility] Genetic Counselor [email or web address] Phone: [phone]

## 2022-03-09 DIAGNOSIS — Z419 Encounter for procedure for purposes other than remedying health state, unspecified: Secondary | ICD-10-CM | POA: Diagnosis not present

## 2022-04-09 DIAGNOSIS — Z419 Encounter for procedure for purposes other than remedying health state, unspecified: Secondary | ICD-10-CM | POA: Diagnosis not present

## 2022-05-09 DIAGNOSIS — Z419 Encounter for procedure for purposes other than remedying health state, unspecified: Secondary | ICD-10-CM | POA: Diagnosis not present

## 2022-06-09 DIAGNOSIS — Z419 Encounter for procedure for purposes other than remedying health state, unspecified: Secondary | ICD-10-CM | POA: Diagnosis not present

## 2022-07-09 DIAGNOSIS — Z419 Encounter for procedure for purposes other than remedying health state, unspecified: Secondary | ICD-10-CM | POA: Diagnosis not present

## 2022-08-09 DIAGNOSIS — Z419 Encounter for procedure for purposes other than remedying health state, unspecified: Secondary | ICD-10-CM | POA: Diagnosis not present

## 2022-09-09 DIAGNOSIS — Z419 Encounter for procedure for purposes other than remedying health state, unspecified: Secondary | ICD-10-CM | POA: Diagnosis not present

## 2022-10-08 DIAGNOSIS — Z419 Encounter for procedure for purposes other than remedying health state, unspecified: Secondary | ICD-10-CM | POA: Diagnosis not present

## 2022-11-08 DIAGNOSIS — Z419 Encounter for procedure for purposes other than remedying health state, unspecified: Secondary | ICD-10-CM | POA: Diagnosis not present

## 2022-12-08 DIAGNOSIS — Z419 Encounter for procedure for purposes other than remedying health state, unspecified: Secondary | ICD-10-CM | POA: Diagnosis not present

## 2022-12-08 DIAGNOSIS — N921 Excessive and frequent menstruation with irregular cycle: Secondary | ICD-10-CM | POA: Diagnosis not present

## 2022-12-08 DIAGNOSIS — Z30432 Encounter for removal of intrauterine contraceptive device: Secondary | ICD-10-CM | POA: Diagnosis not present

## 2022-12-08 DIAGNOSIS — Z1151 Encounter for screening for human papillomavirus (HPV): Secondary | ICD-10-CM | POA: Diagnosis not present

## 2022-12-08 DIAGNOSIS — Z113 Encounter for screening for infections with a predominantly sexual mode of transmission: Secondary | ICD-10-CM | POA: Diagnosis not present

## 2022-12-08 DIAGNOSIS — N760 Acute vaginitis: Secondary | ICD-10-CM | POA: Diagnosis not present

## 2022-12-08 DIAGNOSIS — T8384XA Pain from genitourinary prosthetic devices, implants and grafts, initial encounter: Secondary | ICD-10-CM | POA: Diagnosis not present

## 2022-12-08 DIAGNOSIS — R87612 Low grade squamous intraepithelial lesion on cytologic smear of cervix (LGSIL): Secondary | ICD-10-CM | POA: Diagnosis not present

## 2022-12-08 DIAGNOSIS — R87610 Atypical squamous cells of undetermined significance on cytologic smear of cervix (ASC-US): Secondary | ICD-10-CM | POA: Diagnosis not present

## 2023-01-08 DIAGNOSIS — Z419 Encounter for procedure for purposes other than remedying health state, unspecified: Secondary | ICD-10-CM | POA: Diagnosis not present

## 2023-01-12 DIAGNOSIS — N898 Other specified noninflammatory disorders of vagina: Secondary | ICD-10-CM | POA: Diagnosis not present

## 2023-01-28 DIAGNOSIS — N898 Other specified noninflammatory disorders of vagina: Secondary | ICD-10-CM | POA: Diagnosis not present

## 2023-01-28 DIAGNOSIS — R102 Pelvic and perineal pain: Secondary | ICD-10-CM | POA: Diagnosis not present

## 2023-01-28 DIAGNOSIS — Z3009 Encounter for other general counseling and advice on contraception: Secondary | ICD-10-CM | POA: Diagnosis not present

## 2023-02-07 DIAGNOSIS — Z419 Encounter for procedure for purposes other than remedying health state, unspecified: Secondary | ICD-10-CM | POA: Diagnosis not present

## 2023-03-10 DIAGNOSIS — Z419 Encounter for procedure for purposes other than remedying health state, unspecified: Secondary | ICD-10-CM | POA: Diagnosis not present

## 2023-04-10 DIAGNOSIS — Z419 Encounter for procedure for purposes other than remedying health state, unspecified: Secondary | ICD-10-CM | POA: Diagnosis not present

## 2023-05-10 DIAGNOSIS — Z419 Encounter for procedure for purposes other than remedying health state, unspecified: Secondary | ICD-10-CM | POA: Diagnosis not present

## 2023-06-10 DIAGNOSIS — Z419 Encounter for procedure for purposes other than remedying health state, unspecified: Secondary | ICD-10-CM | POA: Diagnosis not present

## 2023-07-10 DIAGNOSIS — Z419 Encounter for procedure for purposes other than remedying health state, unspecified: Secondary | ICD-10-CM | POA: Diagnosis not present

## 2023-07-21 ENCOUNTER — Ambulatory Visit: Payer: Self-pay

## 2023-07-21 NOTE — Telephone Encounter (Signed)
Chief Complaint: Sore Throat Symptoms: Mild sore throat, cough, chest tightness, white spots back of throat Frequency: comes and goes Pertinent Negatives: Patient denies fever, nausea, vomiting Disposition: [] ED /[x] Urgent Care (no appt availability in office) / [] Appointment(In office/virtual)/ []  [facility]/ [] Home Care/ [] Refused Recommended Disposition /[] [facility]/ []  Follow-up with PCP Additional Notes: Patient states she has a sore throat that started 3 days ago with a mild cold. Patient states she can see something white in the back of her mouth but its hard to tell. Patient reports working around a lot of dust that cause her to have chest tightness some times. Care advice given and  and urgent care recommended. Patient declined appointment and stated she will go as a walk in.New patient appointment was scheduled as well.  Reason for Disposition  [1] Sore throat is the only symptom AND [2] present > 48 hours  Answer Assessment - Initial Assessment Questions 1. ONSET: "When did the throat start hurting?" (Hours or days ago)      3 days  2. SEVERITY: "How bad is the sore throat?" (Scale 1-10; mild, moderate or severe)   - MILD (1-3):  Doesn't interfere with eating or normal activities.   - MODERATE (4-7): Interferes with eating some solids and normal activities.   - SEVERE (8-10):  Excruciating pain, interferes with most normal activities.   - SEVERE WITH DYSPHAGIA (10): Can't swallow liquids, drooling.     Mild  3. STREP EXPOSURE: "Has there been any exposure to strep within the past week?" If Yes, ask: "What type of contact occurred?"      No 4.  VIRAL SYMPTOMS: "Are there any symptoms of a cold, such as a runny nose, cough, hoarse voice or red eyes?"      Cough, chest tightness, itchy throat 5. FEVER: "Do you have a fever?" If Yes, ask: "What is your temperature, how was it measured, and when did it start?"     No, I don't think so  6. PUS ON THE TONSILS:  "Is there pus on the tonsils in the back of your throat?"     Maybe, I see something back there  7. OTHER SYMPTOMS: "Do you have any other symptoms?" (e.g., difficulty breathing, headache, rash)     Itchy throat, chest tightness, headache, congested  Protocols used: Sore Throat-A-AH

## 2023-08-10 DIAGNOSIS — Z419 Encounter for procedure for purposes other than remedying health state, unspecified: Secondary | ICD-10-CM | POA: Diagnosis not present

## 2023-09-10 DIAGNOSIS — Z419 Encounter for procedure for purposes other than remedying health state, unspecified: Secondary | ICD-10-CM | POA: Diagnosis not present

## 2023-09-15 ENCOUNTER — Ambulatory Visit: Payer: Medicaid Other | Admitting: Pediatrics

## 2023-10-08 DIAGNOSIS — Z419 Encounter for procedure for purposes other than remedying health state, unspecified: Secondary | ICD-10-CM | POA: Diagnosis not present

## 2023-11-19 DIAGNOSIS — Z419 Encounter for procedure for purposes other than remedying health state, unspecified: Secondary | ICD-10-CM | POA: Diagnosis not present

## 2023-11-22 ENCOUNTER — Other Ambulatory Visit

## 2023-11-28 ENCOUNTER — Other Ambulatory Visit: Payer: Self-pay

## 2023-11-28 ENCOUNTER — Ambulatory Visit (LOCAL_COMMUNITY_HEALTH_CENTER): Payer: Self-pay

## 2023-11-28 DIAGNOSIS — Z111 Encounter for screening for respiratory tuberculosis: Secondary | ICD-10-CM

## 2023-11-28 NOTE — Progress Notes (Deleted)
 PPD

## 2023-11-30 ENCOUNTER — Ambulatory Visit

## 2023-11-30 DIAGNOSIS — Z111 Encounter for screening for respiratory tuberculosis: Secondary | ICD-10-CM

## 2023-11-30 LAB — TB SKIN TEST
Induration: 0 mm
TB Skin Test: NEGATIVE

## 2023-12-19 DIAGNOSIS — Z419 Encounter for procedure for purposes other than remedying health state, unspecified: Secondary | ICD-10-CM | POA: Diagnosis not present

## 2024-01-19 DIAGNOSIS — Z419 Encounter for procedure for purposes other than remedying health state, unspecified: Secondary | ICD-10-CM | POA: Diagnosis not present

## 2024-01-30 IMAGING — MG DIGITAL DIAGNOSTIC BILAT W/ TOMO W/ CAD
7 of 14 series · 7 of 40 positions shown · non-contrast
Comparison: None.

CLINICAL DATA: Non spontaneous RIGHT nipple milky discharge; ceased
breast feeding approximately 1 year ago. Nonfocal RIGHT breast pain.
Sensation of diffuse fullness and heaviness in the RIGHT breast.
Clinically appreciated palpable area in the LEFT breast between 10
and 2 o'clock. Maternal aunt with breast cancer at the age of 42.



[L CC synth-2D]
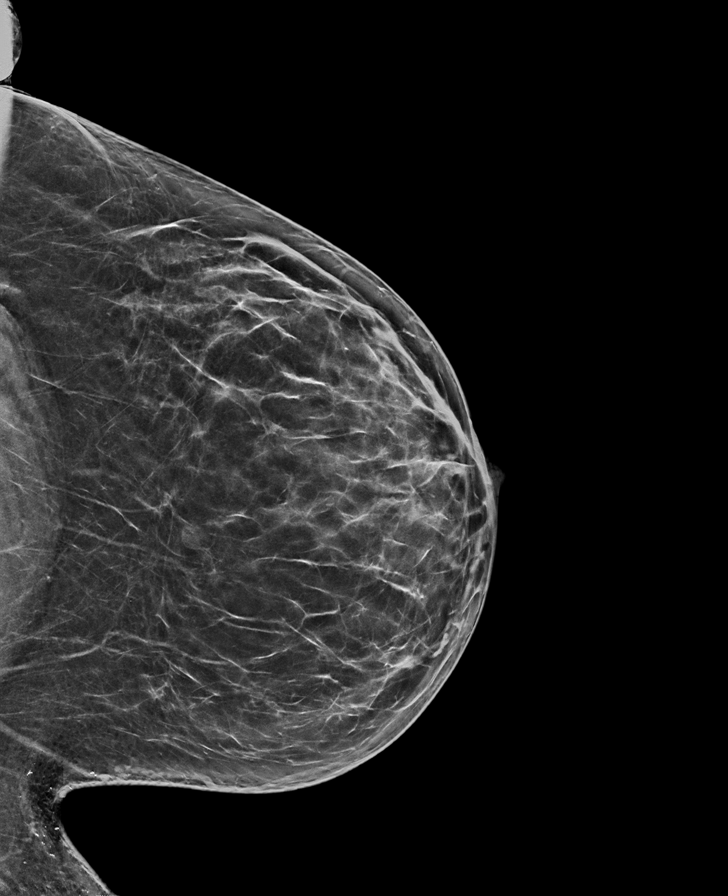

[L TAN synth-2D]
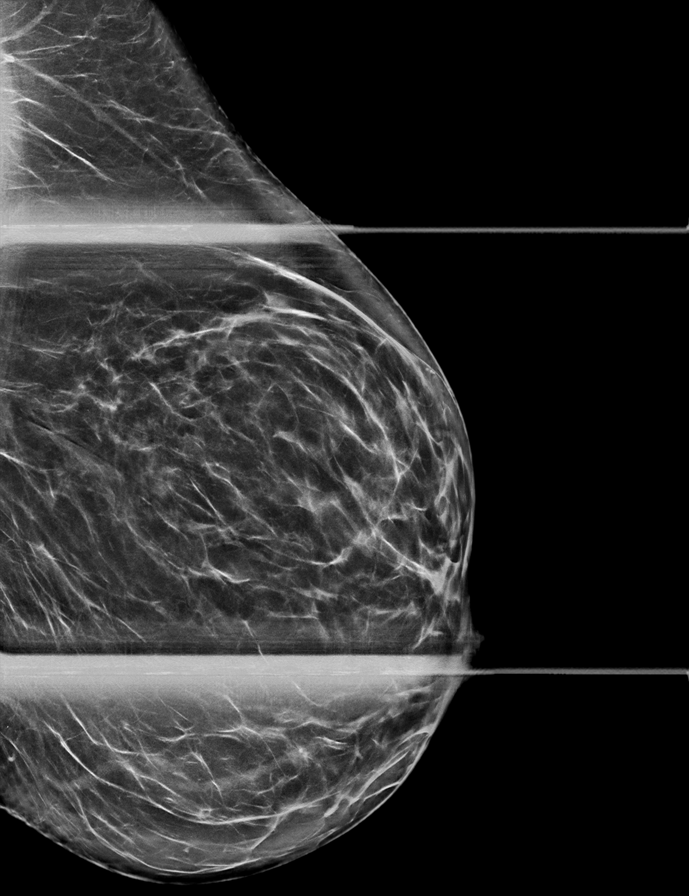

[R ML synth-2D]
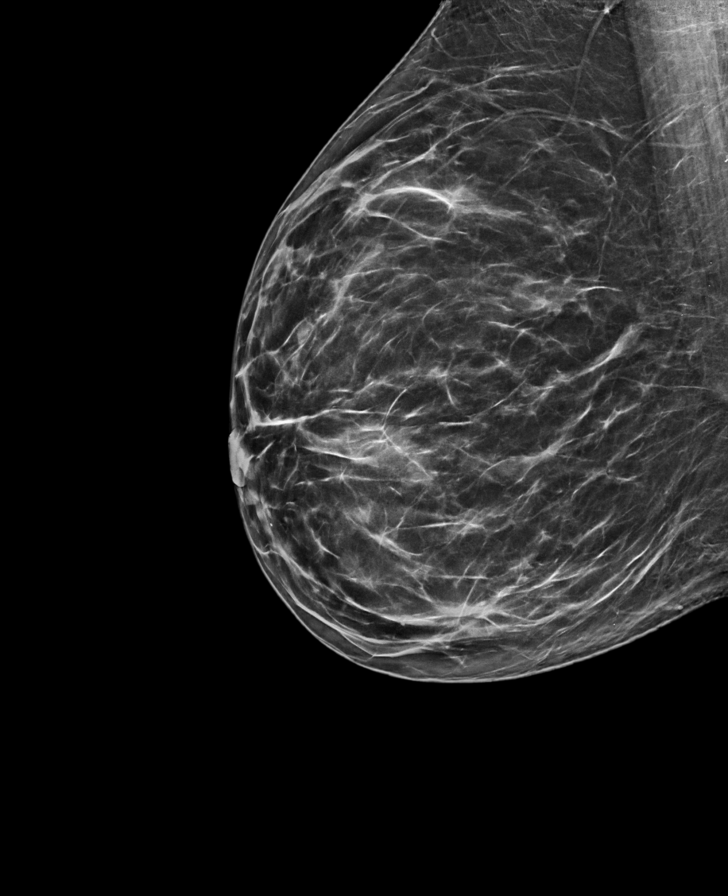

[R MLO synth-2D]
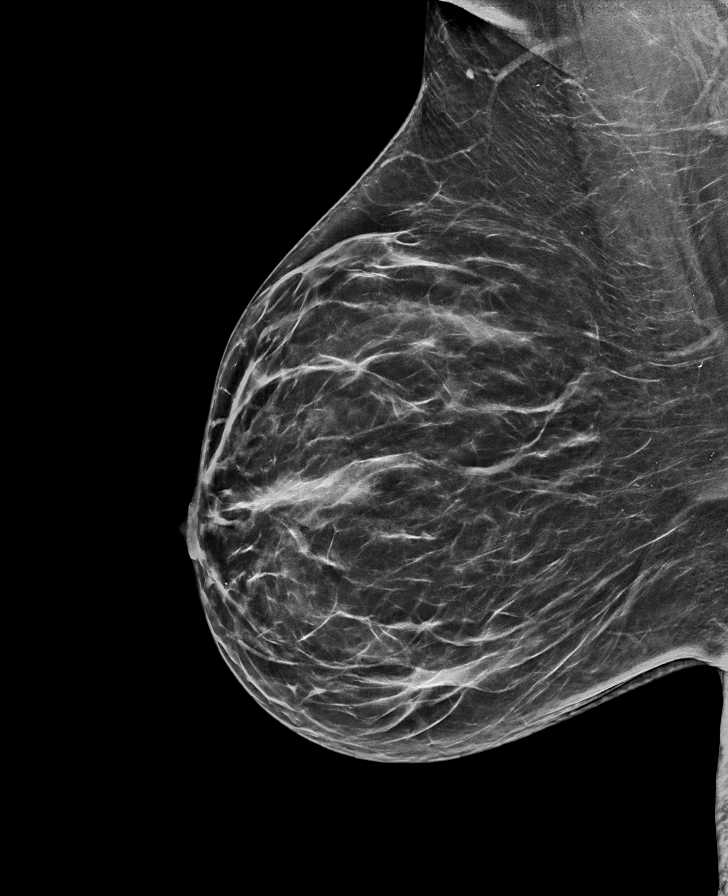

[L MLO synth-2D]
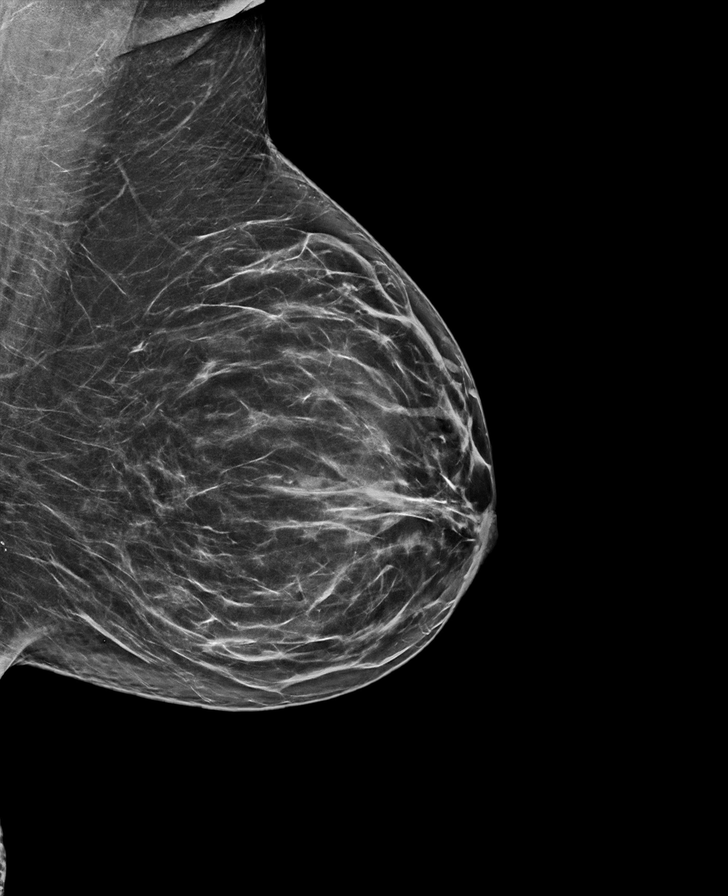

[R TAN synth-2D]
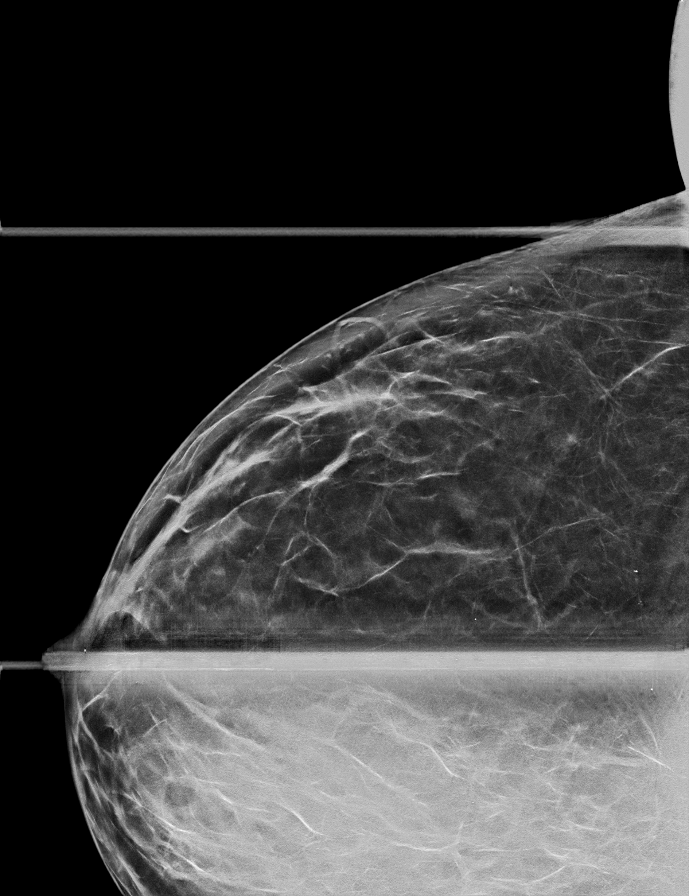

[R CC synth-2D]
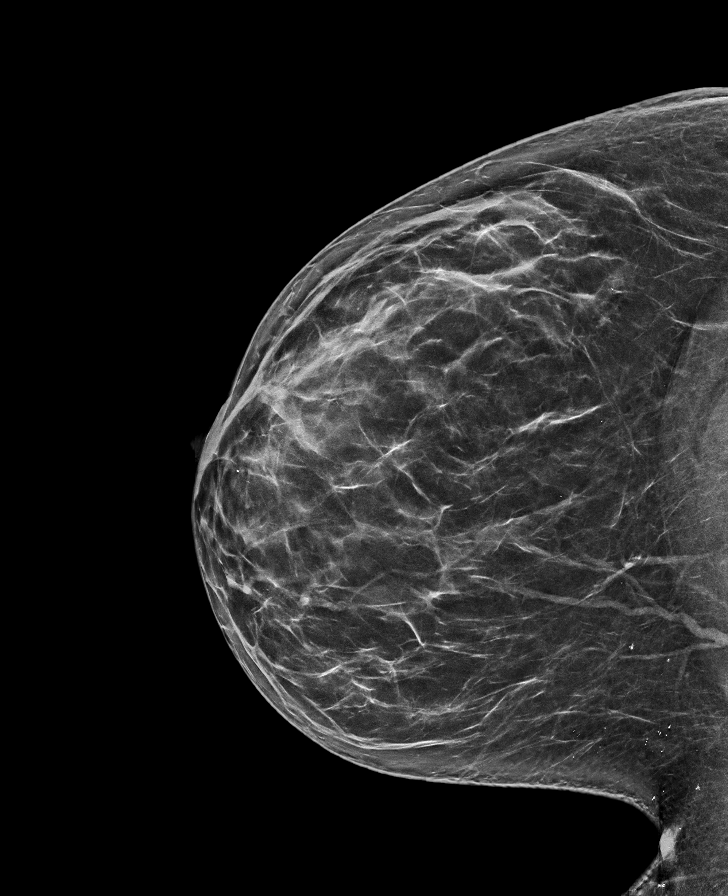

[7 of 40 positions shown; findings below may reference images not displayed]

ACR Breast Density Category b: There are scattered areas of
fibroglandular density.
FINDINGS: Spot compression tomosynthesis views were obtained over the palpable
area of concern in the LEFT breast. No suspicious mammographic
finding is identified in this area. No suspicious mass,
microcalcification, or other finding is identified in the LEFT
breast.

Spot compression tomosynthesis views were obtained of the site of
painful concern in the RIGHT outer breast. No suspicious
mammographic finding is identified in this area. No suspicious
mammographic etiology for non spontaneous nipple discharge is
identified. No suspicious mass, distortion, or microcalcifications
are identified to suggest presence of malignancy.

On physical exam, no suspicious mass is appreciated.

Targeted LEFT breast ultrasound was performed in the palpable area
of concern at the upper breast. No suspicious solid or cystic mass
is identified. Benign duct ectasia is noted.

Targeted RIGHT ultrasound was performed of the RIGHT retroareolar
breast. No suspicious cystic or solid mass is seen. No suspicious
intraductal mass is seen. Benign duct ectasia is noted.

Targeted ultrasound was performed of the site of most focal pain in
the RIGHT outer breast. No suspicious cystic or solid mass is seen.
IMPRESSION: 1. No mammographic or sonographic evidence of malignancy at the site
of palpable concern in the LEFT breast. Any further workup of the
patient's symptoms should be based on the clinical assessment.
2. No mammographic or sonographic evidence of malignancy at the site
of painful concern in the RIGHT breast. No suspicious etiology for
non spontaneous nipple discharge is identified. Benign duct ectasia
is noted. This is likely physiologic in etiology. Any further workup
of the patient's symptoms should be based on the clinical
assessment.
3. No mammographic evidence of malignancy bilaterally.

RECOMMENDATION:
Recommend clinical evaluation for candidacy for annual high risk
screening protocol given family history of breast cancer at a young
age. If patient is of elevated lifetime risk of breast cancer,
recommend initiation of annual screening mammogram and annual
screening MRI. The American Cancer Society recommends annual MRI and
mammography in patients with an estimated lifetime risk of
developing breast cancer greater than 20 - 25%, or who are known or
suspected to be positive for the breast cancer gene.

If patient is of average lifetime risk of breast cancer, recommend
initiation of annual screening mammography at the age of 40.

I have discussed the findings and recommendations with the patient.
If applicable, a reminder letter will be sent to the patient
regarding the next appointment.

BI-RADS CATEGORY  2: Benign.

## 2024-02-08 DIAGNOSIS — Z1339 Encounter for screening examination for other mental health and behavioral disorders: Secondary | ICD-10-CM | POA: Diagnosis not present

## 2024-02-08 DIAGNOSIS — N941 Unspecified dyspareunia: Secondary | ICD-10-CM | POA: Diagnosis not present

## 2024-02-08 DIAGNOSIS — Z131 Encounter for screening for diabetes mellitus: Secondary | ICD-10-CM | POA: Diagnosis not present

## 2024-02-08 DIAGNOSIS — Z13220 Encounter for screening for lipoid disorders: Secondary | ICD-10-CM | POA: Diagnosis not present

## 2024-02-08 DIAGNOSIS — Z1331 Encounter for screening for depression: Secondary | ICD-10-CM | POA: Diagnosis not present

## 2024-02-08 DIAGNOSIS — R102 Pelvic and perineal pain: Secondary | ICD-10-CM | POA: Diagnosis not present

## 2024-02-08 DIAGNOSIS — Z01411 Encounter for gynecological examination (general) (routine) with abnormal findings: Secondary | ICD-10-CM | POA: Diagnosis not present

## 2024-02-18 DIAGNOSIS — Z419 Encounter for procedure for purposes other than remedying health state, unspecified: Secondary | ICD-10-CM | POA: Diagnosis not present

## 2024-03-20 DIAGNOSIS — Z419 Encounter for procedure for purposes other than remedying health state, unspecified: Secondary | ICD-10-CM | POA: Diagnosis not present

## 2024-03-30 DIAGNOSIS — N912 Amenorrhea, unspecified: Secondary | ICD-10-CM | POA: Diagnosis not present

## 2024-03-30 DIAGNOSIS — F419 Anxiety disorder, unspecified: Secondary | ICD-10-CM | POA: Diagnosis not present

## 2024-04-02 DIAGNOSIS — O3680X0 Pregnancy with inconclusive fetal viability, not applicable or unspecified: Secondary | ICD-10-CM | POA: Diagnosis not present

## 2024-04-20 DIAGNOSIS — Z419 Encounter for procedure for purposes other than remedying health state, unspecified: Secondary | ICD-10-CM | POA: Diagnosis not present

## 2024-05-02 ENCOUNTER — Other Ambulatory Visit

## 2024-05-18 DIAGNOSIS — N944 Primary dysmenorrhea: Secondary | ICD-10-CM | POA: Diagnosis not present

## 2024-05-18 DIAGNOSIS — N920 Excessive and frequent menstruation with regular cycle: Secondary | ICD-10-CM | POA: Diagnosis not present

## 2024-05-18 DIAGNOSIS — F419 Anxiety disorder, unspecified: Secondary | ICD-10-CM | POA: Diagnosis not present

## 2024-05-18 NOTE — H&P (View-Only) (Signed)
 Chief Complaint:    Patient ID: Sandra Buchanan is a 33 y.o. female presenting with Pre Op Consulting (Sign consents)  on 05/18/2024  HPI: Dysmenorrhea and menorrhagia, unrelieved by hormonal birth control, with painful uterine palpation. Declines IUD for pain reasons.  Pap smear:  7/25: neg/ neg  EMBx: deferred due to significant pain with palpation U/S: 12/22/21  Ut wnl        Endometrium=  9.84 mm         iud seen ut fundus bil ovs wnl  - NSVD x2 - No prior abdominal surgeries - Hx of anxiety  Past Medical History:  has a past medical history of Multiple benign bone tumors (HHS-HCC).  Past Surgical History:  has a past surgical history that includes RIGHT HAND SURGERY. Family History: family history includes Asthma in her brother; Autism in her brother; Breast cancer (age of onset: 42) in her maternal aunt; Diabetes in her mother; High blood pressure (Hypertension) in her mother; Hyperlipidemia (Elevated cholesterol) in her brother; Obesity in her mother; Osteoarthritis in her mother. Social History:  reports that she has never smoked. She has never used smokeless tobacco. She reports that she does not currently use alcohol. She reports current drug use. Drug: Marijuana. OB/GYN History:  OB History     Gravida  2   Para  2   Term  2   Preterm      AB      Living  2      SAB      IAB      Ectopic      Molar      Multiple      Live Births  2          Allergies: is allergic to latex. Medications:  Current Outpatient Medications:  .  sertraline (ZOLOFT) 50 MG tablet, Take 1/2 tablet daily x 1 week, then increase to 1 tab daily, Disp: 30 tablet, Rfl: 11 .  levonorgestreL  (LILETTA ) 20.4 mcg/24 hrs (8 yrs) 52 mg IUD, Insert 1 each into the uterus once (Patient not taking: Reported on 05/18/2024), Disp: , Rfl:  .  metroNIDAZOLE (METROGEL) 0.75 % (37.5mg /5 gram) vaginal gel, Place 1 applicator vaginally at bedtime For 5 nights (Patient not taking:  Reported on 05/18/2024), Disp: 70 g, Rfl: 0  Review of Systems: No SOB, no palpitations or chest pain, no new lower extremity edema, no nausea or vomiting or bowel or bladder complaints. See HPI for gyn specific ROS.   Exam:     BP 121/82   Pulse 82   Ht 170.2 cm (5' 7)   Wt 88.4 kg (194 lb 12.8 oz)   BMI 30.51 kg/m   General: Patient is well-groomed, well-nourished, appears stated age in no acute distress   HEENT: head is atraumatic and normocephalic, trachea is midline, neck is supple with no palpable nodules   CV: Regular rhythm and normal heart rate, no murmur   Pulm: Clear to auscultation throughout lung fields with no wheezing, crackles, or rhonchi. No increased work of breathing        Impression:   The primary encounter diagnosis was Excessive or frequent menstruation. Diagnoses of Anxiety and Primary dysmenorrhea were also pertinent to this visit.    Plan:   Patient returns for a preoperative discussion regarding her plans to proceed with definitive surgical treatment of her dysmenorrhea and menorrhagia by  robotic assisted total laparoscopic hysterectomy with bilateral salpingectomy.  The patient and I discussed the technical  aspects of the procedure including the potential for risks and complications.  These include but are not limited to the risk of infection requiring post-operative antibiotics or further procedures.  We talked about the risk of injury to adjacent organs including bladder, bowel, ureter, blood vessels or nerves.  We talked about the need to convert to an open incision.  We talked about the possible need for blood transfusion.  We talked about postop complications such as thromboembolic or cardiopulmonary complications.  All of her questions were answered.  Her preoperative exam was completed and the appropriate consents were signed. She is scheduled to undergo this procedure in the near future.  Postop 2 weeks after surgery  [name], MD

## 2024-05-22 ENCOUNTER — Other Ambulatory Visit: Payer: Self-pay

## 2024-05-22 ENCOUNTER — Encounter
Admission: RE | Admit: 2024-05-22 | Discharge: 2024-05-22 | Disposition: A | Discharge status: Home / Self Care | Source: Ambulatory Visit | Attending: Obstetrics and Gynecology | Admitting: Obstetrics and Gynecology

## 2024-05-22 VITALS — Ht 67.0 in | Wt 192.0 lb

## 2024-05-22 DIAGNOSIS — Z01812 Encounter for preprocedural laboratory examination: Secondary | ICD-10-CM

## 2024-05-22 HISTORY — DX: Unspecified dyspareunia: N94.10

## 2024-05-22 HISTORY — DX: Nausea with vomiting, unspecified: Z98.890

## 2024-05-22 HISTORY — DX: Nausea with vomiting, unspecified: R11.2

## 2024-05-22 HISTORY — DX: Amenorrhea, unspecified: N91.2

## 2024-05-22 HISTORY — DX: Pelvic and perineal pain unspecified side: R10.20

## 2024-05-22 HISTORY — DX: Enchondromatosis: Q78.4

## 2024-05-22 HISTORY — DX: Anxiety disorder, unspecified: F41.9

## 2024-05-22 NOTE — Patient Instructions (Addendum)
 Your procedure is scheduled on: Tuesday 05/29/24 Report to the Registration Desk on the 1st floor of the [facility]. To find out your arrival time, please call [phone] between 1PM - 3PM on: Monday 10/202/25) If your arrival time is 6:00 am, do not arrive before that time as the [facility] entrance doors do not open until 6:00 am.  REMEMBER: Instructions that are not followed completely may result in serious medical risk, up to and including death; or upon the discretion of your surgeon and anesthesiologist your surgery may need to be rescheduled.  Do not eat food after midnight the night before surgery.  No gum chewing or hard candies.   One week prior to surgery: Stop Anti-inflammatories (NSAIDS) such as Advil , Aleve, Ibuprofen , Motrin , Naproxen, Naprosyn and Aspirin based products such as Excedrin, Goody's Powder, BC Powder. Stop ANY OVER THE COUNTER supplements until after surgery.  You may however, continue to take Tylenol  if needed for pain up until the day of surgery.   Continue taking all of your other prescription medications up until the day of surgery.  ON THE DAY OF SURGERY ONLY TAKE THESE MEDICATIONS WITH SIPS OF WATER:  sertraline (ZOLOFT) 50 MG    No Alcohol for 24 hours before or after surgery.  No Smoking including e-cigarettes for 24 hours before surgery.  No chewable tobacco products for at least 6 hours before surgery.  No nicotine patches on the day of surgery.  Do not use any recreational drugs for at least a week (preferably 2 weeks) before your surgery.  Please be advised that the combination of cocaine and anesthesia may have negative outcomes, up to and including death. If you test positive for cocaine, your surgery will be cancelled.  On the morning of surgery brush your teeth with toothpaste and water, you may rinse your mouth with mouthwash if you wish. Do not swallow any toothpaste or mouthwash.  Use CHG Soap or wipes as directed on  instruction sheet.  Do not wear jewelry, make-up, hairpins, clips or nail polish.  For welded (permanent) jewelry: bracelets, anklets, waist bands, etc.  Please have this removed prior to surgery.  If it is not removed, there is a chance that hospital personnel will need to cut it off on the day of surgery.  Do not wear lotions, powders, or perfumes.   Do not shave body hair from the neck down 48 hours before surgery.  Contact lenses, hearing aids and dentures may not be worn into surgery.  Do not bring valuables to the hospital. [facility] is not responsible for any missing/lost belongings or valuables.   Notify your doctor if there is any change in your medical condition (cold, fever, infection).  Wear comfortable clothing (specific to your surgery type) to the hospital.  After surgery, you can help prevent lung complications by doing breathing exercises.  Take deep breaths and cough every 1-2 hours. Your doctor may order a device called an Incentive Spirometer to help you take deep breaths. When coughing or sneezing, hold a pillow firmly against your incision with both hands. This is called "splinting." Doing this helps protect your incision. It also decreases belly discomfort.  If you are being admitted to the hospital overnight, leave your suitcase in the car. After surgery it may be brought to your room.  In case of increased patient census, it may be necessary for you, the patient, to continue your postoperative care in the Same Day Surgery department.  If you are being  discharged the day of surgery, you will not be allowed to drive home. You will need a responsible individual to drive you home and stay with you for 24 hours after surgery.   If you are taking public transportation, you will need to have a responsible individual with you.  Please call the Pre-admissions Testing Dept. at [phone] if you have any questions about these instructions.  Surgery Visitation  Policy:  Patients having surgery or a procedure may have two visitors.  Children under the age of 9 must have an adult with them who is not the patient.  Inpatient Visitation:    Visiting hours are 7 a.m. to 8 p.m. Up to four visitors are allowed at one time in a patient room. The visitors may rotate out with other people during the day.  One visitor age 72 or older may stay with the patient overnight and must be in the room by 8 p.m.   Merchandiser, retail to address health-related social needs:  [email or web address]                                                                                                             Preparing for Surgery with CHLORHEXIDINE GLUCONATE (CHG) Soap  Chlorhexidine Gluconate (CHG) Soap  o An antiseptic cleaner that kills germs and bonds with the skin to continue killing germs even after washing  o Used for showering the night before surgery and morning of surgery  Before surgery, you can play an important role by reducing the number of germs on your skin.  CHG (Chlorhexidine gluconate) soap is an antiseptic cleanser which kills germs and bonds with the skin to continue killing germs even after washing.  Please do not use if you have an allergy to CHG or antibacterial soaps. If your skin becomes reddened/irritated stop using the CHG.  1. Shower the NIGHT BEFORE SURGERY with CHG soap.  2. If you choose to wash your hair, wash your hair first as usual with your normal shampoo.  3. After shampooing, rinse your hair and body thoroughly to remove the shampoo.  4. Use CHG as you would any other liquid soap. You can apply CHG directly to the skin and wash gently with a clean washcloth.  5. Apply the CHG soap to your body only from the neck down. Do not use on open wounds or open sores. Avoid contact with your eyes, ears, mouth, and genitals (private parts). Wash face and genitals (private parts) with your normal soap.  6. Wash  thoroughly, paying special attention to the area where your surgery will be performed.  7. Thoroughly rinse your body with warm water.  8. Do not shower/wash with your normal soap after using and rinsing off the CHG soap.  9. Do not use lotions, oils, etc., after showering with CHG.  10. Pat yourself dry with a clean towel.  11. Wear clean pajamas to bed the night before surgery.  12. Place clean sheets on your bed the night of your shower and do not sleep with  pets.  13. Do not apply any deodorants/lotions/powders.  14. Please wear clean clothes to the hospital.  15. Remember to brush your teeth with your regular toothpaste.  How to Use an Incentive Spirometer  An incentive spirometer is a tool that measures how well you are filling your lungs with each breath. Learning to take long, deep breaths using this tool can help you keep your lungs clear and active. This may help to reverse or lessen your chance of developing breathing (pulmonary) problems, especially infection. You may be asked to use a spirometer: After a surgery. If you have a lung problem or a history of smoking. After a long period of time when you have been unable to move or be active. If the spirometer includes an indicator to show the highest number that you have reached, your health care provider or respiratory therapist will help you set a goal. Keep a log of your progress as told by your health care provider. What are the risks? Breathing too quickly may cause dizziness or cause you to pass out. Take your time so you do not get dizzy or light-headed. If you are in pain, you may need to take pain medicine before doing incentive spirometry. It is harder to take a deep breath if you are having pain. How to use your incentive spirometer  Sit up on the edge of your bed or on a chair. Hold the incentive spirometer so that it is in an upright position. Before you use the spirometer, breathe out normally. Place the  mouthpiece in your mouth. Make sure your lips are closed tightly around it. Breathe in slowly and as deeply as you can through your mouth, causing the piston or the ball to rise toward the top of the chamber. Hold your breath for 3-5 seconds, or for as long as possible. If the spirometer includes a coach indicator, use this to guide you in breathing. Slow down your breathing if the indicator goes above the marked areas. Remove the mouthpiece from your mouth and breathe out normally. The piston or ball will return to the bottom of the chamber. Rest for a few seconds, then repeat the steps 10 or more times. Take your time and take a few normal breaths between deep breaths so that you do not get dizzy or light-headed. Do this every 1-2 hours when you are awake. If the spirometer includes a goal marker to show the highest number you have reached (best effort), use this as a goal to work toward during each repetition. After each set of 10 deep breaths, cough a few times. This will help to make sure that your lungs are clear. If you have an incision on your chest or abdomen from surgery, place a pillow or a rolled-up towel firmly against the incision when you cough. This can help to reduce pain while taking deep breaths and coughing. General tips When you are able to get out of bed: Walk around often. Continue to take deep breaths and cough in order to clear your lungs. Keep using the incentive spirometer until your health care provider says it is okay to stop using it. If you have been in the hospital, you may be told to keep using the spirometer at home. Contact a health care provider if: You are having difficulty using the spirometer. You have trouble using the spirometer as often as instructed. Your pain medicine is not giving enough relief for you to use the spirometer as told. You have a  fever. Get help right away if: You develop shortness of breath. You develop a cough with bloody mucus from  the lungs. You have fluid or blood coming from an incision site after you cough. Summary An incentive spirometer is a tool that can help you learn to take long, deep breaths to keep your lungs clear and active. You may be asked to use a spirometer after a surgery, if you have a lung problem or a history of smoking, or if you have been inactive for a long period of time. Use your incentive spirometer as instructed every 1-2 hours while you are awake. If you have an incision on your chest or abdomen, place a pillow or a rolled-up towel firmly against your incision when you cough. This will help to reduce pain. Get help right away if you have shortness of breath, you cough up bloody mucus, or blood comes from your incision when you cough. This information is not intended to replace advice given to you by your health care provider. Make sure you discuss any questions you have with your health care provider. Document Revised: 10/15/2019 Document Reviewed: 10/15/2019 Elsevier Patient Education  2023 [employer].

## 2024-05-23 ENCOUNTER — Encounter: Payer: Self-pay | Admitting: Urgent Care

## 2024-05-23 ENCOUNTER — Encounter
Admission: RE | Admit: 2024-05-23 | Discharge: 2024-05-23 | Disposition: A | Discharge status: Home / Self Care | Source: Ambulatory Visit | Attending: Obstetrics and Gynecology | Admitting: Obstetrics and Gynecology

## 2024-05-23 DIAGNOSIS — Z01818 Encounter for other preprocedural examination: Secondary | ICD-10-CM | POA: Diagnosis present

## 2024-05-23 DIAGNOSIS — Z01812 Encounter for preprocedural laboratory examination: Secondary | ICD-10-CM | POA: Insufficient documentation

## 2024-05-23 LAB — TYPE AND SCREEN
ABO/RH(D): AB POS
Antibody Screen: NEGATIVE

## 2024-05-23 LAB — CBC
HCT: 38.9 % (ref 36.0–46.0)
Hemoglobin: 13.4 g/dL (ref 12.0–15.0)
MCH: 29.7 pg (ref 26.0–34.0)
MCHC: 34.4 g/dL (ref 30.0–36.0)
MCV: 86.3 fL (ref 80.0–100.0)
Platelets: 221 K/uL (ref 150–400)
RBC: 4.51 MIL/uL (ref 3.87–5.11)
RDW: 12.9 % (ref 11.5–15.5)
WBC: 3.9 K/uL — ABNORMAL LOW (ref 4.0–10.5)
nRBC: 0 % (ref 0.0–0.2)

## 2024-05-29 ENCOUNTER — Encounter: Payer: Self-pay | Admitting: Obstetrics and Gynecology

## 2024-05-29 ENCOUNTER — Ambulatory Visit
Admission: RE | Admit: 2024-05-29 | Discharge: 2024-05-29 | Disposition: A | Discharge status: Home / Self Care | Source: Ambulatory Visit | Attending: Obstetrics and Gynecology | Admitting: Obstetrics and Gynecology

## 2024-05-29 ENCOUNTER — Other Ambulatory Visit: Payer: Self-pay

## 2024-05-29 ENCOUNTER — Ambulatory Visit: Payer: Self-pay

## 2024-05-29 ENCOUNTER — Encounter
Admission: RE | Disposition: A | Discharge status: Home / Self Care | Payer: Self-pay | Source: Ambulatory Visit | Attending: Obstetrics and Gynecology

## 2024-05-29 ENCOUNTER — Ambulatory Visit: Payer: Self-pay | Admitting: Urgent Care

## 2024-05-29 DIAGNOSIS — N920 Excessive and frequent menstruation with regular cycle: Secondary | ICD-10-CM | POA: Diagnosis present

## 2024-05-29 DIAGNOSIS — N944 Primary dysmenorrhea: Secondary | ICD-10-CM | POA: Insufficient documentation

## 2024-05-29 DIAGNOSIS — N888 Other specified noninflammatory disorders of cervix uteri: Secondary | ICD-10-CM | POA: Diagnosis not present

## 2024-05-29 DIAGNOSIS — F419 Anxiety disorder, unspecified: Secondary | ICD-10-CM | POA: Diagnosis not present

## 2024-05-29 DIAGNOSIS — R102 Pelvic and perineal pain unspecified side: Secondary | ICD-10-CM | POA: Insufficient documentation

## 2024-05-29 DIAGNOSIS — N946 Dysmenorrhea, unspecified: Secondary | ICD-10-CM | POA: Diagnosis not present

## 2024-05-29 DIAGNOSIS — G8929 Other chronic pain: Secondary | ICD-10-CM | POA: Insufficient documentation

## 2024-05-29 DIAGNOSIS — N3281 Overactive bladder: Secondary | ICD-10-CM | POA: Diagnosis not present

## 2024-05-29 DIAGNOSIS — Z01812 Encounter for preprocedural laboratory examination: Secondary | ICD-10-CM

## 2024-05-29 HISTORY — PX: CYSTOSCOPY: SHX5120

## 2024-05-29 HISTORY — PX: HYSTERECTOMY, TOTAL, LAPAROSCOPIC, ROBOT-ASSISTED WITH SALPINGECTOMY: SHX7587

## 2024-05-29 LAB — POCT PREGNANCY, URINE: Preg Test, Ur: NEGATIVE

## 2024-05-29 SURGERY — HYSTERECTOMY, TOTAL, LAPAROSCOPIC, ROBOT-ASSISTED WITH SALPINGECTOMY
Anesthesia: General | Site: Uterus

## 2024-05-29 MED ORDER — GABAPENTIN 300 MG PO CAPS
300.0000 mg | ORAL_CAPSULE | Freq: Once | ORAL | Status: AC
  Administered 2024-05-29: 300 mg via ORAL

## 2024-05-29 MED ORDER — LIDOCAINE HCL (PF) 2 % IJ SOLN
INTRAMUSCULAR | Status: AC
  Filled 2024-05-29: qty 5

## 2024-05-29 MED ORDER — DROPERIDOL 2.5 MG/ML IJ SOLN: 0.6250 mg | Freq: Once | INTRAMUSCULAR | Status: DC | PRN

## 2024-05-29 MED ORDER — ACETAMINOPHEN 10 MG/ML IV SOLN: 1000.0000 mg | Freq: Once | INTRAVENOUS | Status: DC | PRN

## 2024-05-29 MED ORDER — DOCUSATE SODIUM 100 MG PO CAPS: 100.0000 mg | ORAL_CAPSULE | Freq: Two times a day (BID) | ORAL | 0 refills | Status: AC

## 2024-05-29 MED ORDER — SUGAMMADEX SODIUM 200 MG/2ML IV SOLN
INTRAVENOUS | Status: DC | PRN
  Administered 2024-05-29: 180 mg via INTRAVENOUS

## 2024-05-29 MED ORDER — CHLORHEXIDINE GLUCONATE 0.12 % MT SOLN
OROMUCOSAL | Status: AC
Start: 2024-05-29 — End: 2024-05-29
  Filled 2024-05-29: qty 15

## 2024-05-29 MED ORDER — CEFAZOLIN SODIUM-DEXTROSE 2-4 GM/100ML-% IV SOLN
INTRAVENOUS | Status: AC
  Filled 2024-05-29: qty 100

## 2024-05-29 MED ORDER — CEFAZOLIN SODIUM-DEXTROSE 2-3 GM-%(50ML) IV SOLR
INTRAVENOUS | Status: DC | PRN
Start: 2024-05-29 — End: 2024-05-29
  Administered 2024-05-29: 2 g via INTRAVENOUS

## 2024-05-29 MED ORDER — DEXAMETHASONE SOD PHOSPHATE PF 10 MG/ML IJ SOLN
INTRAMUSCULAR | Status: DC | PRN
  Administered 2024-05-29: 10 mg via INTRAVENOUS

## 2024-05-29 MED ORDER — ROCURONIUM BROMIDE 10 MG/ML (PF) SYRINGE
PREFILLED_SYRINGE | INTRAVENOUS | Status: AC
  Filled 2024-05-29: qty 10

## 2024-05-29 MED ORDER — ORAL CARE MOUTH RINSE: 15.0000 mL | Freq: Once | OROMUCOSAL | Status: AC

## 2024-05-29 MED ORDER — ONDANSETRON 4 MG PO TBDP: 4.0000 mg | ORAL_TABLET | Freq: Three times a day (TID) | ORAL | 0 refills | Status: AC | PRN

## 2024-05-29 MED ORDER — GABAPENTIN 300 MG PO CAPS: 300.0000 mg | ORAL_CAPSULE | Freq: Every day | ORAL | 0 refills | Status: AC

## 2024-05-29 MED ORDER — MIDAZOLAM HCL 2 MG/2ML IJ SOLN
INTRAMUSCULAR | Status: AC
  Filled 2024-05-29: qty 2

## 2024-05-29 MED ORDER — FENTANYL CITRATE (PF) 100 MCG/2ML IJ SOLN
INTRAMUSCULAR | Status: AC
  Filled 2024-05-29: qty 2

## 2024-05-29 MED ORDER — PROPOFOL 10 MG/ML IV BOLUS
INTRAVENOUS | Status: AC
  Filled 2024-05-29: qty 20

## 2024-05-29 MED ORDER — ACETAMINOPHEN 500 MG PO TABS
1000.0000 mg | ORAL_TABLET | Freq: Once | ORAL | Status: AC
  Administered 2024-05-29: 1000 mg via ORAL

## 2024-05-29 MED ORDER — PHENYLEPHRINE HCL-NACL 20-0.9 MG/250ML-% IV SOLN
INTRAVENOUS | Status: AC
  Filled 2024-05-29: qty 250

## 2024-05-29 MED ORDER — CELECOXIB 200 MG PO CAPS
200.0000 mg | ORAL_CAPSULE | Freq: Once | ORAL | Status: AC
  Administered 2024-05-29: 200 mg via ORAL

## 2024-05-29 MED ORDER — ROCURONIUM BROMIDE 100 MG/10ML IV SOLN
INTRAVENOUS | Status: DC | PRN
Start: 2024-05-29 — End: 2024-05-29
  Administered 2024-05-29: 10 mg via INTRAVENOUS
  Administered 2024-05-29: 50 mg via INTRAVENOUS

## 2024-05-29 MED ORDER — LIDOCAINE HCL (CARDIAC) PF 100 MG/5ML IV SOSY
PREFILLED_SYRINGE | INTRAVENOUS | Status: DC | PRN
  Administered 2024-05-29: 100 mg via INTRAVENOUS

## 2024-05-29 MED ORDER — GLYCOPYRROLATE 0.2 MG/ML IJ SOLN
INTRAMUSCULAR | Status: DC | PRN
  Administered 2024-05-29: .1 mg via INTRAVENOUS

## 2024-05-29 MED ORDER — 0.9 % SODIUM CHLORIDE (POUR BTL) OPTIME
TOPICAL | Status: DC | PRN
  Administered 2024-05-29: 500 mL

## 2024-05-29 MED ORDER — LACTATED RINGERS IV SOLN
INTRAVENOUS | Status: DC
Start: 2024-05-29 — End: 2024-05-29

## 2024-05-29 MED ORDER — FENTANYL CITRATE (PF) 100 MCG/2ML IJ SOLN
INTRAMUSCULAR | Status: DC | PRN
  Administered 2024-05-29 (×2): 50 ug via INTRAVENOUS

## 2024-05-29 MED ORDER — KETAMINE HCL 50 MG/5ML IJ SOSY
PREFILLED_SYRINGE | INTRAMUSCULAR | Status: DC | PRN
  Administered 2024-05-29: 20 mg via INTRAVENOUS
  Administered 2024-05-29: 10 mg via INTRAVENOUS

## 2024-05-29 MED ORDER — CELECOXIB 200 MG PO CAPS: 200.0000 mg | ORAL_CAPSULE | Freq: Two times a day (BID) | ORAL | 0 refills | Status: AC

## 2024-05-29 MED ORDER — FUROSEMIDE 10 MG/ML IJ SOLN
INTRAMUSCULAR | Status: AC
  Filled 2024-05-29: qty 4

## 2024-05-29 MED ORDER — GABAPENTIN 300 MG PO CAPS
ORAL_CAPSULE | ORAL | Status: AC
  Filled 2024-05-29: qty 1

## 2024-05-29 MED ORDER — ONDANSETRON HCL 4 MG/2ML IJ SOLN
INTRAMUSCULAR | Status: AC
Start: 2024-05-29 — End: 2024-05-29
  Filled 2024-05-29: qty 4

## 2024-05-29 MED ORDER — FLUORESCEIN SODIUM 10 % IV SOLN
INTRAVENOUS | Status: AC
  Filled 2024-05-29: qty 5

## 2024-05-29 MED ORDER — OXYCODONE HCL 5 MG PO TABS
5.0000 mg | ORAL_TABLET | Freq: Once | ORAL | Status: AC | PRN
  Administered 2024-05-29: 5 mg via ORAL

## 2024-05-29 MED ORDER — FLUORESCEIN SODIUM 10 % IV SOLN
INTRAVENOUS | Status: DC | PRN
Start: 2024-05-29 — End: 2024-05-29
  Administered 2024-05-29: 50 mg via INTRAVENOUS

## 2024-05-29 MED ORDER — PROPOFOL 1000 MG/100ML IV EMUL
INTRAVENOUS | Status: AC
  Filled 2024-05-29: qty 100

## 2024-05-29 MED ORDER — ACETAMINOPHEN EXTRA STRENGTH 500 MG PO TABS: 1000.0000 mg | ORAL_TABLET | Freq: Four times a day (QID) | ORAL | 0 refills | Status: AC

## 2024-05-29 MED ORDER — CELECOXIB 200 MG PO CAPS
ORAL_CAPSULE | ORAL | Status: AC
  Filled 2024-05-29: qty 1

## 2024-05-29 MED ORDER — OXYCODONE HCL 5 MG/5ML PO SOLN: 5.0000 mg | Freq: Once | ORAL | Status: AC | PRN

## 2024-05-29 MED ORDER — BUPIVACAINE HCL (PF) 0.5 % IJ SOLN
INTRAMUSCULAR | Status: DC | PRN
  Administered 2024-05-29: 10 mL

## 2024-05-29 MED ORDER — MIDAZOLAM HCL (PF) 2 MG/2ML IJ SOLN
INTRAMUSCULAR | Status: DC | PRN
  Administered 2024-05-29: 2 mg via INTRAVENOUS

## 2024-05-29 MED ORDER — ONDANSETRON HCL 4 MG/2ML IJ SOLN
INTRAMUSCULAR | Status: DC | PRN
  Administered 2024-05-29: 4 mg via INTRAVENOUS

## 2024-05-29 MED ORDER — FENTANYL CITRATE (PF) 100 MCG/2ML IJ SOLN
25.0000 ug | INTRAMUSCULAR | Status: DC | PRN
  Administered 2024-05-29 (×2): 50 ug via INTRAVENOUS

## 2024-05-29 MED ORDER — PROPOFOL 10 MG/ML IV BOLUS
INTRAVENOUS | Status: DC | PRN
  Administered 2024-05-29: 120 ug/kg/min via INTRAVENOUS
  Administered 2024-05-29: 200 mg via INTRAVENOUS

## 2024-05-29 MED ORDER — PROPOFOL 1000 MG/100ML IV EMUL
INTRAVENOUS | Status: AC
Start: 2024-05-29 — End: 2024-05-29
  Filled 2024-05-29: qty 100

## 2024-05-29 MED ORDER — OXYCODONE HCL 5 MG PO TABS
ORAL_TABLET | ORAL | Status: AC
  Filled 2024-05-29: qty 1

## 2024-05-29 MED ORDER — KETAMINE HCL 50 MG/5ML IJ SOSY
PREFILLED_SYRINGE | INTRAMUSCULAR | Status: AC
  Filled 2024-05-29: qty 5

## 2024-05-29 MED ORDER — FUROSEMIDE 10 MG/ML IJ SOLN
INTRAMUSCULAR | Status: DC | PRN
  Administered 2024-05-29: 10 mg via INTRAMUSCULAR

## 2024-05-29 MED ORDER — GLYCOPYRROLATE 0.2 MG/ML IJ SOLN
INTRAMUSCULAR | Status: AC
  Filled 2024-05-29: qty 1

## 2024-05-29 MED ORDER — CHLORHEXIDINE GLUCONATE 0.12 % MT SOLN
15.0000 mL | Freq: Once | OROMUCOSAL | Status: AC
  Administered 2024-05-29: 15 mL via OROMUCOSAL

## 2024-05-29 MED ORDER — BUPIVACAINE HCL (PF) 0.5 % IJ SOLN
INTRAMUSCULAR | Status: AC
  Filled 2024-05-29: qty 30

## 2024-05-29 MED ORDER — ACETAMINOPHEN 500 MG PO TABS
ORAL_TABLET | ORAL | Status: AC
  Filled 2024-05-29: qty 2

## 2024-05-29 MED ORDER — OXYCODONE HCL 5 MG PO TABS: 5.0000 mg | ORAL_TABLET | ORAL | 0 refills | Status: AC | PRN

## 2024-05-29 MED ORDER — SODIUM CHLORIDE 0.9 % IR SOLN
Status: DC | PRN
  Administered 2024-05-29: 1000 mL

## 2024-05-29 SURGICAL SUPPLY — 48 items
BAG URINE DRAIN 2000ML AR STRL (UROLOGICAL SUPPLIES) ×2 IMPLANT
BLADE SURG SZ11 CARB STEEL (BLADE) ×2 IMPLANT
CANNULA CAP OBTURATR AIRSEAL 8 (CAP) ×2 IMPLANT
CATH ROBINSON RED A/P 16FR (CATHETERS) ×2 IMPLANT
CATH URTH 16FR FL 2W BLN LF (CATHETERS) ×2 IMPLANT
COVER TIP SHEARS 8 DVNC (MISCELLANEOUS) ×2 IMPLANT
DERMABOND ADVANCED .7 DNX12 (GAUZE/BANDAGES/DRESSINGS) ×2 IMPLANT
DRAPE ARM DVNC X/XI (DISPOSABLE) ×6 IMPLANT
DRAPE COLUMN DVNC XI (DISPOSABLE) ×2 IMPLANT
DRIVER NDL MEGA 8 DVNC XI (INSTRUMENTS) ×2 IMPLANT
DRIVER NDLE MEGA DVNC XI (INSTRUMENTS) ×2 IMPLANT
ELECTRODE REM PT RTRN 9FT ADLT (ELECTROSURGICAL) ×2 IMPLANT
FORCEPS BPLR FENES DVNC XI (FORCEP) ×2 IMPLANT
GAUZE 4X4 16PLY ~~LOC~~+RFID DBL (SPONGE) ×2 IMPLANT
GLOVE BIO SURGEON STRL SZ7 (GLOVE) ×8 IMPLANT
GLOVE BIOGEL PI IND STRL 7.5 (GLOVE) ×8 IMPLANT
GLOVE INDICATOR 7.5 STRL GRN (GLOVE) ×2 IMPLANT
GOWN STRL REUS W/ TWL LRG LVL3 (GOWN DISPOSABLE) ×6 IMPLANT
IRRIGATION STRYKERFLOW (MISCELLANEOUS) IMPLANT
IV 0.9% NACL 1000 ML (IV SOLUTION) IMPLANT
IV LR 1000 ML (IV SOLUTION) ×2 IMPLANT
KIT PINK PAD W/HEAD ARM REST (MISCELLANEOUS) ×2 IMPLANT
LABEL OR SOLS (LABEL) ×2 IMPLANT
MANIFOLD NEPTUNE II (INSTRUMENTS) ×2 IMPLANT
MANIPULATOR VCARE LG CRV RETR (MISCELLANEOUS) IMPLANT
MANIPULATOR VCARE STD CRV RETR (MISCELLANEOUS) IMPLANT
NDL 21 GA WING INFUSION (NEEDLE) IMPLANT
NEEDLE 21 GA WING INFUSION (NEEDLE) IMPLANT
NS IRRIG 500ML POUR BTL (IV SOLUTION) ×2 IMPLANT
OBTURATOR OPTICALSTD 8 DVNC (TROCAR) ×2 IMPLANT
OCCLUDER COLPOPNEUMO (BALLOONS) ×2 IMPLANT
PACK GYN LAPAROSCOPIC (MISCELLANEOUS) ×2 IMPLANT
PAD PREP OB/GYN DISP 24X41 (PERSONAL CARE ITEMS) ×2 IMPLANT
SCISSORS MNPLR CVD DVNC XI (INSTRUMENTS) ×2 IMPLANT
SCRUB CHG 4% DYNA-HEX 4OZ (MISCELLANEOUS) ×2 IMPLANT
SEAL UNIV 5-12 XI (MISCELLANEOUS) ×4 IMPLANT
SEALER VESSEL EXT DVNC XI (MISCELLANEOUS) IMPLANT
SET CYSTO IRRIGATION (SET/KITS/TRAYS/PACK) ×2 IMPLANT
SET TUBE FILTERED XL AIRSEAL (SET/KITS/TRAYS/PACK) ×2 IMPLANT
SOLUTION ELECTROSURG ANTI STCK (MISCELLANEOUS) ×2 IMPLANT
SOLUTION PREP PVP 2OZ (MISCELLANEOUS) ×2 IMPLANT
SURGILUBE 2OZ TUBE FLIPTOP (MISCELLANEOUS) ×2 IMPLANT
SUT STRATA PDS 0 30 CT-2.5 (SUTURE) ×2 IMPLANT
SUTURE MNCRL 4-0 27XMF (SUTURE) ×2 IMPLANT
SYR 50ML LL SCALE MARK (SYRINGE) ×2 IMPLANT
TRAP FLUID SMOKE EVACUATOR (MISCELLANEOUS) ×2 IMPLANT
TUBING ART PRESS 48 MALE/FEM (TUBING) IMPLANT
WATER STERILE IRR 500ML POUR (IV SOLUTION) ×2 IMPLANT

## 2024-05-29 NOTE — Discharge Instructions (Addendum)
 Discharge instructions after  robotically-assisted total laparoscopic hysterectomy   You will notice that your urine is highlighter yellow. It will clear tomorrow.   For the next three days, take ibuprofen  and acetaminophen  on a schedule, every 8 hours. You can take them together or you can intersperse them, and take one every four hours. I also gave you gabapentin for nighttime, to help you sleep and also to control pain. Take gabapentin medicines at night for at least the next 3 nights. You also have a narcotic, oxycodone , to take as needed if the above medicines don't help.  Postop constipation is a major cause of pain. Stay well hydrated, walk as you tolerate, and take over the counter senna as well as stool softeners if you need them.   Signs and Symptoms to Report Call our office at [phone] if you have any of the following.   Fever over 100.4 degrees or higher  Severe stomach pain not relieved with pain medications  Bright red bleeding that's heavier than a period that does not slow with rest  To go the bathroom a lot (frequency), you can't hold your urine (urgency), or it hurts when you empty your bladder (urinate)  Chest pain  Shortness of breath  Pain in the calves of your legs  Severe nausea and vomiting not relieved with anti-nausea medications  Signs of infection around your wounds, such as redness, hot to touch, swelling, green/yellow drainage (like pus), bad smelling discharge  Any concerns  What You Can Expect after Surgery  You may see some pink tinged, bloody fluid and bruising around the wound. This is normal.  You may notice shoulder and neck pain. This is caused by the gas used during surgery to expand your abdomen so your surgeon could get to the uterus easier.  You may have a sore throat because of the tube in your mouth during general anesthesia. This will go away in 2 to 3 days.  You may have some stomach cramps.  You may notice spotting on your panties.   You may have pain around the incision sites.   Activities after Your Discharge Follow these guidelines to help speed your recovery at home:  Do the coughing and deep breathing as you did in the hospital for 2 weeks. Use the small blue breathing device, called the incentive spirometer for 2 weeks.  Don't drive if you are in pain or taking narcotic pain medicine. You may drive when you can safely slam on the brakes, turn the wheel forcefully, and rotate your torso comfortably. This is typically 1-2 weeks. Practice in a parking lot or side street prior to attempting to drive regularly.   Ask others to help with household chores for 4 weeks.  Do not lift anything heavier that 10 pounds for 4-6 weeks. This includes pets, children, and groceries.  Don't do strenuous activities, exercises, or sports like vacuuming, tennis, squash, etc. until your doctor says it is safe to do so. ---Maintain pelvic rest for 12 weeks. This means nothing in the vagina or rectum at all (no douching, tampons, intercourse) for 12 weeks.   Walk as you feel able. Rest often since it may take two or three weeks for your energy level to return to normal.   You may climb stairs  Avoid constipation:   -Eat fruits, vegetables, and whole grains. Eat small meals as your appetite will take time to return to normal.   -Drink 6 to 8 glasses of water each day unless  your doctor has told you to limit your fluids.   -Use a laxative or stool softener as needed if constipation becomes a problem. You may take Miralax, metamucil, Citrucil, Colace, Senekot, FiberCon, etc. If this does not relieve the constipation, try two tablespoons of Milk Of Magnesia every 8 hours until your bowels move.   You may shower. Gently wash the wounds with a mild soap and water. Pat dry.  Do not get in a hot tub, swimming pool, etc. for 6 weeks.  Do not use lotions, oils, powders on the wounds.  Do not douche, use tampons, or have sex until your doctor says it is  okay.  Take your pain medicine when you need it. The medicine may not work as well if the pain is bad.  Take the medicines you were taking before surgery. Other medications you will need are pain medications (Norco or Percocet) and nausea medications (Zofran ).

## 2024-05-29 NOTE — Anesthesia Postprocedure Evaluation (Signed)
 Anesthesia Post Note  Patient: Sandra Buchanan  Procedure(s) Performed: HYSTERECTOMY, TOTAL, LAPAROSCOPIC, ROBOT-ASSISTED WITH SALPINGECTOMY (Bilateral: Uterus) CYSTOSCOPY (Bladder)  Patient location during evaluation: PACU Anesthesia Type: General Level of consciousness: awake and alert Pain management: pain level controlled Vital Signs Assessment: post-procedure vital signs reviewed and stable Respiratory status: spontaneous breathing, nonlabored ventilation, respiratory function stable and patient connected to nasal cannula oxygen Cardiovascular status: blood pressure returned to baseline and stable Postop Assessment: no apparent nausea or vomiting Anesthetic complications: no   No notable events documented.   Last Vitals:  Vitals:   05/29/24 1626 05/29/24 1728  BP: 130/87 (!) 140/81  Pulse: (!) 57 81  Resp: 18 18  Temp: (!) 36.1 C (!) 36 C  SpO2: 100% 100%    Last Pain:  Vitals:   05/29/24 1728  TempSrc: Temporal  PainSc: 2                  [name]

## 2024-05-29 NOTE — Op Note (Signed)
 Sandra Buchanan PROCEDURE DATE: 05/29/2024  PREOPERATIVE DIAGNOSIS: Chronic pelvic pain, menorrhagia POSTOPERATIVE DIAGNOSIS: The same PROCEDURE:  HYSTERECTOMY, TOTAL, LAPAROSCOPIC, ROBOT-ASSISTED WITH SALPINGECTOMY: 58571 (CPT)  CYSTOSCOPY: 52000 (CPT)   SURGEON:  Dr. Heather Penton, MD ASSISTANT: RNFA  Anesthesiologist:  Anesthesiologist: Myra Lynwood MATSU, MD; Vicci Camellia Glatter, MD CRNA: Veronica Alm BROCKS, CRNA; Jackye Spanner, CRNA Student Nurse Anesthetist: Janit Raisin, RN  An experienced assistant was required given the standard of surgical care given the complexity of the case.  This assistant was needed for exposure, dissection, suctioning, retraction, instrument exchange: RNFA provided expert assistance required for safety during the procedure.   INDICATIONS: 33 y.o. F  here for definitive surgical management secondary to the indications listed under preoperative diagnoses; please see preoperative note for further details.  Risks of surgery were discussed with the patient including but not limited to: bleeding which may require transfusion or reoperation; infection which may require antibiotics; injury to bowel, bladder, ureters or other surrounding organs; need for additional procedures; thromboembolic phenomenon, incisional problems and other postoperative/anesthesia complications. Written informed consent was obtained.    FINDINGS:    External genitalia, vaginal canal and cervix negative for lesions. Intraoperative findings revealed a normal upper abdomen including bowel, liver, diaphragmatic surfaces, stomach, and omentum.   The uterus was small and mobile.  The right and left ovaries appeared normal, except for scar tissue tissue between right ovary and uterus. 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 Bilateral tubes appeared normal.  Appendix not visualized   ANESTHESIA:    General INTRAVENOUS FLUIDS:600  ml ESTIMATED BLOOD LOSS:10  ml URINE OUTPUT: 200 ml  SPECIMENS: Uterus, cervix, bilateral fallopian tubes COMPLICATIONS: None immediate   RATLH/BS:  PROCEDURE IN DETAIL: After informed consent was obtained, the patient was taken to the operating room where general anesthesia was obtained without difficulty. The patient was positioned in the dorsal lithotomy position in [place] stirrups and her arms were carefully tucked at her sides and the usual precautions were taken. Deep Trendelenburg (20-25 deg) was established to confirm that she does not shift on the table.  She was prepped and draped in normal sterile fashion.  Time-out was performed and a Foley catheter was placed into the bladder. A standard VCare uterine manipulator was then placed in the uterus without incident.  Preoperative prophylactic antibiotics were given through her iv.  After infiltration of local anesthetic at the proposed trocar sites, an 8 mm incision was created at the umbilicus, and an AirSeal 5mm was placed under direct visualization, after confirmation of OG tube working well. Pneumoperitoneum was created to a pressure of 15 mm Hg. The camera was placed and the abdomin surveyed, noting intact bowel below the site of entry. A survey of the pelvis and upper abdomen revealed the above findings. One right and one left lateral 8-mm robotic ports were placed under direct visualization.  The patient was placed in deepTrendelenburg and the bowel was displaced up into the upper abdomen. The robot was left side docked. The instruments were placed under direct visualization.   The ureters were identified bilaterally coursing outside of the operative field. Round ligaments were divided on each side with the EndoShears and the retroperitoneal space was opened bilaterally. The posterior leaflet of the broad was taken down to the level of the IP ligament. The anterior leaflet of the broad ligament was carefully taken down to the midline.  A bladder flap was created  and the bladder was dissected down off the lower uterine  segment and cervix using endoshears and electrocautery.   The Fallopian tubes were divided from the ovaries, and care taken to hemostatically transect the utero-ovarian ligament. The peritoneum was taken down to the level of the internal os, and the uterine arteries skeletonized. With strong cephalad pressure from the V-care, bipolar cautery was used to seal and transect the uterine arteries, and the pedicles allowed to fall away laterally.  A colpotomy was performed circumferentially along the V-Care ring with monopolar electrocautery and the cervix was incised from the vagina using the laparoscopic scissors. The specimen was removed through the vagina.  A pneumo balloon was placed in the vagina and the vaginal cuff was then closed in a running continuous fashion using the  0 V-Lock suture with careful attention to include the vaginal cuff angles, the uterosacral ligaments and the vaginal mucosa within the closure.  Hemostasis was secured with intraabdominal pressure and review of all surgical sites. The intraperitoneal pressure was dropped, and all planes of dissection, vascular pedicles and the vaginal cuff were found to be hemostatic.  The robot was undocked.   Attention was turned to the bladder and cystoscopy showed vigorous bilateral ureteral jets.  No stitches were visualized in the bladder during cystoscopy. Apparent overactive bladder with beefy detrusor muscles.  The lateral trocars were removed under visualization.  The CO2 gas was released and several deep breaths given to remove any remaining CO2 from the peritoneal cavity.  The skin incisions were closed with 4-0 Monocryl subcuticular stitch and Dermabond.    Anesthesia was reversed without difficulty.  The patient tolerated the procedure well.  Sponge, lap and needle counts were correct x2.  The patient was taken to recovery room in excellent condition.

## 2024-05-29 NOTE — Transfer of Care (Signed)
 Immediate Anesthesia Transfer of Care Note  Patient: Sandra Buchanan  Procedure(s) Performed: HYSTERECTOMY, TOTAL, LAPAROSCOPIC, ROBOT-ASSISTED WITH SALPINGECTOMY (Bilateral: Uterus) CYSTOSCOPY (Bladder)  Patient Location: PACU  Anesthesia Type:General  Level of Consciousness: drowsy  Airway & Oxygen Therapy: Patient Spontanous Breathing and Patient connected to face mask oxygen  Post-op Assessment: Report given to RN and Post -op Vital signs reviewed and stable  Post vital signs: Reviewed and stable  Last Vitals:  Vitals Value Taken Time  BP 115/78 05/29/24 15:25  Temp 35.9 1525  Pulse 66 05/29/24 15:29  Resp 17 05/29/24 15:29  SpO2 100 % 05/29/24 15:29  Vitals shown include unfiled device data.  Last Pain:  Vitals:   05/29/24 1145  TempSrc: Oral  PainSc: 0-No pain         Complications: No notable events documented.

## 2024-05-29 NOTE — Anesthesia Procedure Notes (Signed)
 Procedure Name: Intubation Date/Time: 05/29/2024 1:07 PM  Performed by: Jackye Spanner, CRNAPre-anesthesia Checklist: Patient identified, Patient being monitored, Timeout performed, Emergency Drugs available and Suction available Patient Re-evaluated:Patient Re-evaluated prior to induction Oxygen Delivery Method: Circle system utilized Preoxygenation: Pre-oxygenation with 100% oxygen Induction Type: IV induction Ventilation: Mask ventilation without difficulty Laryngoscope Size: 3 and McGrath Grade View: Grade I Tube type: Oral Tube size: 7.0 mm Number of attempts: 1 Airway Equipment and Method: Stylet Placement Confirmation: ETT inserted through vocal cords under direct vision, positive ETCO2 and breath sounds checked- equal and bilateral Secured at: 22 cm Tube secured with: Tape Dental Injury: Teeth and Oropharynx as per pre-operative assessment  Comments: Smooth atraumatic intubation, no complications noted.

## 2024-05-29 NOTE — Interval H&P Note (Signed)
 History and Physical Interval Note:  05/29/2024 12:28 PM  Sandra Buchanan  has presented today for surgery, with the diagnosis of chronic pelvic pain menorrhagia dysmenorrhea.  The various methods of treatment have been discussed with the patient and family. After consideration of risks, benefits and other options for treatment, the patient has consented to  Procedure(s): HYSTERECTOMY, TOTAL, LAPAROSCOPIC, ROBOT-ASSISTED WITH SALPINGECTOMY (Bilateral) CYSTOSCOPY (N/A) as a surgical intervention.  The patient's history has been reviewed, patient examined, no change in status, stable for surgery.  I have reviewed the patient's chart and labs.  Questions were answered to the patient's satisfaction.     Heather Penton

## 2024-05-29 NOTE — Anesthesia Preprocedure Evaluation (Addendum)
 Anesthesia Evaluation  Patient identified by MRN, date of birth, ID band Patient awake    Reviewed: Allergy & Precautions, H&P , NPO status , Patient's Chart, lab work & pertinent test results  History of Anesthesia Complications (+) PONV and history of anesthetic complications  Airway Mallampati: II  TM Distance: >3 FB Neck ROM: full    Dental no notable dental hx.    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 No date: Pelvic and perineal pain No date: PONV (postoperative nausea and vomiting) No date: Unspecified dyspareunia  Past Surgical History: 1999: hand and arm surgery  2008: HAND SURGERY; Right     Comment:  tumor No date: wisdom teeth surgery     Reproductive/Obstetrics negative OB ROS                              Anesthesia Physical Anesthesia Plan  ASA: 2  Anesthesia Plan: General ETT and General   Post-op Pain Management: Tylenol  PO (pre-op)*, Gabapentin PO (pre-op)* and Celebrex PO (pre-op)*   Induction: Intravenous  PONV Risk Score and Plan: 2 and Ondansetron , Dexamethasone, Midazolam, Propofol infusion and TIVA  Airway Management Planned: Oral ETT  Additional Equipment:   Intra-op Plan:   Post-operative Plan: Extubation in OR  Informed Consent: I have reviewed the patients History and Physical, chart, labs and discussed the  procedure including the risks, benefits and alternatives for the proposed anesthesia with the patient or authorized representative who has indicated his/her understanding and acceptance.     Dental Advisory Given  Plan Discussed with: CRNA and Surgeon  Anesthesia Plan Comments:          Anesthesia Quick Evaluation

## 2024-05-30 ENCOUNTER — Encounter: Payer: Self-pay | Admitting: Obstetrics and Gynecology

## 2024-05-30 ENCOUNTER — Inpatient Hospital Stay: Admission: RE | Admit: 2024-05-30 | Source: Ambulatory Visit

## 2024-05-31 LAB — SURGICAL PATHOLOGY
